# Patient Record
Sex: Male | Born: 1960 | Race: Black or African American | Hispanic: No | Marital: Married | Smoking: Former smoker
Health system: Southern US, Community
[De-identification: ages and names within clinical notes are randomized; demographics above are authoritative.]

## PROBLEM LIST (undated history)

## (undated) DIAGNOSIS — I10 Essential (primary) hypertension: Secondary | ICD-10-CM

## (undated) DIAGNOSIS — E785 Hyperlipidemia, unspecified: Secondary | ICD-10-CM

## (undated) DIAGNOSIS — F1910 Other psychoactive substance abuse, uncomplicated: Secondary | ICD-10-CM

## (undated) DIAGNOSIS — E119 Type 2 diabetes mellitus without complications: Secondary | ICD-10-CM

## (undated) HISTORY — DX: Other psychoactive substance abuse, uncomplicated: F19.10

## (undated) HISTORY — DX: Hyperlipidemia, unspecified: E78.5

## (undated) HISTORY — PX: OTHER SURGICAL HISTORY: SHX169

## (undated) HISTORY — PX: WRIST SURGERY: SHX841

## (undated) HISTORY — DX: Essential (primary) hypertension: I10

## (undated) HISTORY — DX: Type 2 diabetes mellitus without complications: E11.9

---

## 1999-10-18 ENCOUNTER — Encounter: Payer: Self-pay | Admitting: *Deleted

## 1999-10-18 ENCOUNTER — Emergency Department (HOSPITAL_COMMUNITY): Admission: EM | Admit: 1999-10-18 | Discharge: 1999-10-18 | Payer: Self-pay | Admitting: *Deleted

## 2003-02-02 ENCOUNTER — Emergency Department (HOSPITAL_COMMUNITY): Admission: EM | Admit: 2003-02-02 | Discharge: 2003-02-02 | Payer: Self-pay | Admitting: Emergency Medicine

## 2006-01-25 ENCOUNTER — Emergency Department (HOSPITAL_COMMUNITY): Admission: EM | Admit: 2006-01-25 | Discharge: 2006-01-25 | Payer: Self-pay | Admitting: Family Medicine

## 2008-05-02 ENCOUNTER — Emergency Department (HOSPITAL_COMMUNITY): Admission: EM | Admit: 2008-05-02 | Discharge: 2008-05-02 | Payer: Self-pay | Admitting: Emergency Medicine

## 2008-10-25 ENCOUNTER — Emergency Department (HOSPITAL_COMMUNITY): Admission: EM | Admit: 2008-10-25 | Discharge: 2008-10-25 | Payer: Self-pay | Admitting: Emergency Medicine

## 2008-12-28 ENCOUNTER — Emergency Department (HOSPITAL_COMMUNITY): Admission: EM | Admit: 2008-12-28 | Discharge: 2008-12-28 | Payer: Self-pay | Admitting: Emergency Medicine

## 2009-01-04 ENCOUNTER — Ambulatory Visit: Payer: Self-pay | Admitting: Nurse Practitioner

## 2009-01-04 DIAGNOSIS — F17200 Nicotine dependence, unspecified, uncomplicated: Secondary | ICD-10-CM | POA: Insufficient documentation

## 2009-01-04 DIAGNOSIS — I10 Essential (primary) hypertension: Secondary | ICD-10-CM | POA: Insufficient documentation

## 2009-01-04 DIAGNOSIS — E119 Type 2 diabetes mellitus without complications: Secondary | ICD-10-CM | POA: Insufficient documentation

## 2009-01-04 LAB — CONVERTED CEMR LAB
Bilirubin Urine: NEGATIVE
Blood Glucose, Fingerstick: 119
Blood in Urine, dipstick: NEGATIVE
Glucose, Urine, Semiquant: NEGATIVE
Hgb A1c MFr Bld: 7.1 %
Ketones, urine, test strip: NEGATIVE
Nitrite: NEGATIVE
Specific Gravity, Urine: 1.025
Urobilinogen, UA: 0.2
WBC Urine, dipstick: NEGATIVE
pH: 5.5

## 2009-01-05 ENCOUNTER — Telehealth (INDEPENDENT_AMBULATORY_CARE_PROVIDER_SITE_OTHER): Payer: Self-pay | Admitting: Nurse Practitioner

## 2009-01-06 ENCOUNTER — Encounter (INDEPENDENT_AMBULATORY_CARE_PROVIDER_SITE_OTHER): Payer: Self-pay | Admitting: Nurse Practitioner

## 2009-01-06 LAB — CONVERTED CEMR LAB
ALT: 33 units/L (ref 0–53)
AST: 20 units/L (ref 0–37)
Albumin: 4.6 g/dL (ref 3.5–5.2)
Alkaline Phosphatase: 55 units/L (ref 39–117)
BUN: 11 mg/dL (ref 6–23)
Basophils Absolute: 0.1 10*3/uL (ref 0.0–0.1)
Basophils Relative: 1 % (ref 0–1)
CO2: 25 meq/L (ref 19–32)
Calcium: 10.2 mg/dL (ref 8.4–10.5)
Chloride: 106 meq/L (ref 96–112)
Cholesterol: 185 mg/dL (ref 0–200)
Creatinine, Ser: 0.95 mg/dL (ref 0.40–1.50)
Eosinophils Absolute: 0.1 10*3/uL (ref 0.0–0.7)
Eosinophils Relative: 2 % (ref 0–5)
Glucose, Bld: 106 mg/dL — ABNORMAL HIGH (ref 70–99)
HCT: 46 % (ref 39.0–52.0)
HDL: 41 mg/dL (ref >39)
Hemoglobin: 14.7 g/dL (ref 13.0–17.0)
LDL Cholesterol: 108 mg/dL — ABNORMAL HIGH (ref 0–99)
Lymphocytes Relative: 35 % (ref 12–46)
Lymphs Abs: 2.9 10*3/uL (ref 0.7–4.0)
MCHC: 32 g/dL (ref 30.0–36.0)
MCV: 82.7 fL (ref 78.0–100.0)
Monocytes Absolute: 0.6 10*3/uL (ref 0.1–1.0)
Monocytes Relative: 7 % (ref 3–12)
Neutro Abs: 4.7 10*3/uL (ref 1.7–7.7)
Neutrophils Relative %: 57 % (ref 43–77)
PSA: 0.54 ng/mL (ref 0.10–4.00)
Platelets: 447 10*3/uL — ABNORMAL HIGH (ref 150–400)
Potassium: 5.1 meq/L (ref 3.5–5.3)
RBC: 5.56 M/uL (ref 4.22–5.81)
RDW: 14.8 % (ref 11.5–15.5)
Sodium: 143 meq/L (ref 135–145)
TSH: 1.235 microintl units/mL (ref 0.350–4.50)
Total Bilirubin: 0.3 mg/dL (ref 0.3–1.2)
Total CHOL/HDL Ratio: 4.5
Total Protein: 8.2 g/dL (ref 6.0–8.3)
Triglycerides: 181 mg/dL — ABNORMAL HIGH (ref <150)
VLDL: 36 mg/dL (ref 0–40)
WBC: 8.3 10*3/uL (ref 4.0–10.5)

## 2009-01-11 ENCOUNTER — Ambulatory Visit: Payer: Self-pay | Admitting: *Deleted

## 2009-02-01 ENCOUNTER — Ambulatory Visit: Payer: Self-pay | Admitting: Nurse Practitioner

## 2009-02-01 DIAGNOSIS — E7800 Pure hypercholesterolemia, unspecified: Secondary | ICD-10-CM | POA: Insufficient documentation

## 2009-02-01 LAB — CONVERTED CEMR LAB
Bilirubin Urine: NEGATIVE
Blood Glucose, Fingerstick: 126
Glucose, Urine, Semiquant: NEGATIVE
Ketones, urine, test strip: NEGATIVE
Nitrite: NEGATIVE
Protein, U semiquant: 30
Specific Gravity, Urine: 1.025
Urobilinogen, UA: 1
WBC Urine, dipstick: NEGATIVE
pH: 5.5

## 2009-02-02 DIAGNOSIS — R809 Proteinuria, unspecified: Secondary | ICD-10-CM | POA: Insufficient documentation

## 2009-02-02 LAB — CONVERTED CEMR LAB: Microalb, Ur: 4.75 mg/dL — ABNORMAL HIGH (ref 0.00–1.89)

## 2009-02-12 ENCOUNTER — Emergency Department (HOSPITAL_COMMUNITY): Admission: EM | Admit: 2009-02-12 | Discharge: 2009-02-12 | Payer: Self-pay | Admitting: Family Medicine

## 2009-02-17 ENCOUNTER — Ambulatory Visit: Payer: Self-pay | Admitting: Nurse Practitioner

## 2009-02-22 ENCOUNTER — Encounter (INDEPENDENT_AMBULATORY_CARE_PROVIDER_SITE_OTHER): Payer: Self-pay | Admitting: Nurse Practitioner

## 2009-02-24 ENCOUNTER — Encounter (INDEPENDENT_AMBULATORY_CARE_PROVIDER_SITE_OTHER): Payer: Self-pay | Admitting: Nurse Practitioner

## 2009-03-01 ENCOUNTER — Telehealth (INDEPENDENT_AMBULATORY_CARE_PROVIDER_SITE_OTHER): Payer: Self-pay | Admitting: Nurse Practitioner

## 2009-03-01 ENCOUNTER — Ambulatory Visit: Payer: Self-pay | Admitting: Nurse Practitioner

## 2009-03-28 ENCOUNTER — Encounter (INDEPENDENT_AMBULATORY_CARE_PROVIDER_SITE_OTHER): Payer: Self-pay | Admitting: Nurse Practitioner

## 2009-03-29 ENCOUNTER — Ambulatory Visit: Payer: Self-pay | Admitting: Nurse Practitioner

## 2009-03-29 LAB — CONVERTED CEMR LAB
Blood Glucose, Fingerstick: 119
Cholesterol, target level: 200 mg/dL
HDL goal, serum: 40 mg/dL
Hgb A1c MFr Bld: 6 %
LDL Goal: 100 mg/dL

## 2009-03-31 ENCOUNTER — Encounter (INDEPENDENT_AMBULATORY_CARE_PROVIDER_SITE_OTHER): Payer: Self-pay | Admitting: Nurse Practitioner

## 2009-05-18 ENCOUNTER — Telehealth (INDEPENDENT_AMBULATORY_CARE_PROVIDER_SITE_OTHER): Payer: Self-pay | Admitting: Nurse Practitioner

## 2009-05-31 ENCOUNTER — Ambulatory Visit: Payer: Self-pay | Admitting: Nurse Practitioner

## 2009-05-31 LAB — CONVERTED CEMR LAB
Blood Glucose, Fingerstick: 113
Hgb A1c MFr Bld: 6 %

## 2009-06-08 ENCOUNTER — Ambulatory Visit: Payer: Self-pay | Admitting: Nurse Practitioner

## 2009-06-09 ENCOUNTER — Encounter (INDEPENDENT_AMBULATORY_CARE_PROVIDER_SITE_OTHER): Payer: Self-pay | Admitting: Nurse Practitioner

## 2009-06-09 LAB — CONVERTED CEMR LAB
ALT: 21 units/L (ref 0–53)
AST: 18 units/L (ref 0–37)
Albumin: 4.3 g/dL (ref 3.5–5.2)
Alkaline Phosphatase: 40 units/L (ref 39–117)
Bilirubin, Direct: 0.1 mg/dL (ref 0.0–0.3)
Cholesterol: 148 mg/dL (ref 0–200)
HDL: 40 mg/dL (ref >39)
Indirect Bilirubin: 0.2 mg/dL (ref 0.0–0.9)
LDL Cholesterol: 82 mg/dL (ref 0–99)
Total Bilirubin: 0.3 mg/dL (ref 0.3–1.2)
Total CHOL/HDL Ratio: 3.7
Total Protein: 7.3 g/dL (ref 6.0–8.3)
Triglycerides: 132 mg/dL (ref <150)
VLDL: 26 mg/dL (ref 0–40)

## 2009-08-31 ENCOUNTER — Ambulatory Visit: Payer: Self-pay | Admitting: Nurse Practitioner

## 2009-08-31 DIAGNOSIS — K5900 Constipation, unspecified: Secondary | ICD-10-CM | POA: Insufficient documentation

## 2009-08-31 LAB — CONVERTED CEMR LAB
Blood Glucose, Fingerstick: 138
Hgb A1c MFr Bld: 5.9 %

## 2009-10-21 ENCOUNTER — Emergency Department (HOSPITAL_COMMUNITY): Admission: EM | Admit: 2009-10-21 | Discharge: 2009-10-21 | Payer: Self-pay | Admitting: Family Medicine

## 2010-01-10 ENCOUNTER — Ambulatory Visit: Payer: Self-pay | Admitting: Nurse Practitioner

## 2010-01-10 DIAGNOSIS — B351 Tinea unguium: Secondary | ICD-10-CM | POA: Insufficient documentation

## 2010-01-10 LAB — CONVERTED CEMR LAB
Alkaline Phosphatase: 38 units/L — ABNORMAL LOW (ref 39–117)
Basophils Absolute: 0.1 10*3/uL (ref 0.0–0.1)
Basophils Relative: 1 % (ref 0–1)
Blood Glucose, Fingerstick: 102
Glucose, Bld: 103 mg/dL — ABNORMAL HIGH (ref 70–99)
LDL Cholesterol: 73 mg/dL (ref 0–99)
MCHC: 32.6 g/dL (ref 30.0–36.0)
Monocytes Absolute: 0.6 10*3/uL (ref 0.1–1.0)
Neutro Abs: 4.2 10*3/uL (ref 1.7–7.7)
Neutrophils Relative %: 54 % (ref 43–77)
PSA: 0.5 ng/mL (ref 0.10–4.00)
Platelets: 403 10*3/uL — ABNORMAL HIGH (ref 150–400)
RDW: 14.6 % (ref 11.5–15.5)
Rapid HIV Screen: NEGATIVE
Sodium: 139 meq/L (ref 135–145)
Total Bilirubin: 0.4 mg/dL (ref 0.3–1.2)
Total Protein: 7.5 g/dL (ref 6.0–8.3)
Triglycerides: 135 mg/dL (ref <150)
VLDL: 27 mg/dL (ref 0–40)

## 2010-01-23 ENCOUNTER — Encounter (INDEPENDENT_AMBULATORY_CARE_PROVIDER_SITE_OTHER): Payer: Self-pay | Admitting: Nurse Practitioner

## 2010-05-10 ENCOUNTER — Ambulatory Visit: Payer: Self-pay | Admitting: Nurse Practitioner

## 2010-05-10 DIAGNOSIS — E669 Obesity, unspecified: Secondary | ICD-10-CM | POA: Insufficient documentation

## 2010-05-10 DIAGNOSIS — N529 Male erectile dysfunction, unspecified: Secondary | ICD-10-CM | POA: Insufficient documentation

## 2010-05-10 LAB — CONVERTED CEMR LAB: Hgb A1c MFr Bld: 6.5 % — ABNORMAL HIGH (ref <5.7)

## 2010-05-11 ENCOUNTER — Encounter (INDEPENDENT_AMBULATORY_CARE_PROVIDER_SITE_OTHER): Payer: Self-pay | Admitting: Nurse Practitioner

## 2010-09-13 ENCOUNTER — Ambulatory Visit: Payer: Self-pay | Admitting: Nurse Practitioner

## 2010-09-13 LAB — CONVERTED CEMR LAB: Blood Glucose, Fingerstick: 131

## 2010-10-22 ENCOUNTER — Emergency Department (HOSPITAL_COMMUNITY)
Admission: EM | Admit: 2010-10-22 | Discharge: 2010-10-22 | Payer: Self-pay | Source: Home / Self Care | Admitting: Family Medicine

## 2010-12-26 NOTE — Letter (Signed)
Summary: *HSN Results Follow up  [facility]  [address] [place], [place] [address]   Phone: [phone]  Fax: [phone]      05/11/2010   TIWAN SCHNITKER [place] [address] [place], [place]  [address]   Dear  Mr. Tiwan Islam,                            ____S.Drinkard,FNP   ____D. Gore,FNP       ____B. McPherson,MD   ____V. Rankins,MD    ____E. Mulberry,MD    _X___N. Daphine Deutscher, FNP  ____D. Reche Dixon, MD    ____K. Philipp Deputy, MD    ____Other     This letter is to inform you that your recent test(s):  _______Pap Smear    ____X___Lab Test     _______X-ray    ___X____ is within acceptable limits  _______ requires a medication change  _______ requires a follow-up lab visit  _______ requires a follow-up visit with your provider   Comments: Hgba1c = 6.5 during recent lab visit. Your diabetes is still doing well.  Continue current medications.  Remember to keep up your efforts at losing weight.    _________________________________________________________ If you have any questions, please contact our office [phone].                    Sincerely,    Lehman Prom FNP [facility]

## 2010-12-26 NOTE — Assessment & Plan Note (Signed)
Summary: Diabetes/HTN   Vital Signs:  Patient profile:   50 year old male Weight:      204.9 pounds BMI:     29.51 Temp:     97.8 degrees F oral Pulse rate:   71 / minute Pulse rhythm:   regular Resp:     16 per minute BP sitting:   132 / 82  (left arm) Cuff size:   regular  Vitals Entered By: Levon Hedger (May 10, 2010 8:22 AM) CC: follow-up visit DM, Hypertension Management, Lipid Management Is Patient Diabetic? Yes Pain Assessment Patient in pain? no      CBG Result 116 CBG Device ID A  Does patient need assistance? Functional Status Self care Ambulation Normal   CC:  follow-up visit DM, Hypertension Management, and Lipid Management.  History of Present Illness:  Pt into the office for follow up - diabetes  Diabetes Management History:      The patient is a 50 years old male who comes in for evaluation of Type 2 Diabetes Mellitus.  He is (or has been) enrolled in the "Diabetic Education Program".  He states understanding of dietary principles and is following his diet appropriately.  No sensory loss is reported.  Self foot exams are not being performed.  He is not checking home blood sugars.  He says that he is not exercising regularly.        Hypoglycemic symptoms are not occurring.  No hyperglycemic symptoms are reported.  Other comments include: pt is only checking his blood sugar about once per week.        No changes have been made to his treatment plan since last visit.    Hypertension History:      He denies headache, chest pain, and palpitations.  He notes no problems with any antihypertensive medication side effects.  No side effects from medications.        Positive major cardiovascular risk factors include male age 22 years old or older, diabetes, hyperlipidemia, hypertension, and current tobacco user.        Further assessment for target organ damage reveals no history of ASHD, cardiac end-organ damage (CHF/LVH), stroke/TIA, peripheral vascular disease,  renal insufficiency, or hypertensive retinopathy.    Lipid Management History:      Positive NCEP/ATP III risk factors include male age 61 years old or older, diabetes, HDL cholesterol less than 40, current tobacco user, and hypertension.  Negative NCEP/ATP III risk factors include no ASHD (atherosclerotic heart disease), no prior stroke/TIA, no peripheral vascular disease, and no history of aortic aneurysm.        The patient states that he knows about the "Therapeutic Lifestyle Change" diet.  His compliance with the TLC diet is fair.  The patient does not know about adjunctive measures for cholesterol lowering.  Adjunctive measures started by the patient include ASA.  He expresses no side effects from his lipid-lowering medication.  The patient denies any symptoms to suggest myopathy or liver disease.      Habits & Providers  Alcohol-Tobacco-Diet     Alcohol type: quit in 2006     Tobacco Status: current     Tobacco Counseling: to quit use of tobacco products     Cigarette Packs/Day: 1/2     Year Started: age 31     Diet Comments: trying to change diet to accomodate diabetic status  Exercise-Depression-Behavior     Does Patient Exercise: no     Exercise Counseling: to improve exercise regimen  Depression Counseling: not indicated; screening negative for depression     Drug Use: never     Seat Belt Use: always  Comments: Pt was started on Wellbutrin during the last visit and he has been taking as ordered but has not quit smoking.  Admits smoking is his stress reliever.  Allergies (verified): No Known Drug Allergies  Review of Systems CV:  Denies chest pain or discomfort. Resp:  Denies cough. GI:  Denies abdominal pain, nausea, and vomiting. GU:  Complains of erectile dysfunction; Started about 1 month ago.  Physical Exam  General:  alert.   Head:  normocephalic.   Lungs:  normal breath sounds.   Heart:  normal rate and regular rhythm.   Abdomen:  normal bowel sounds.     Msk:  normal ROM.   Neurologic:  alert & oriented X3.    Diabetes Management Exam:    Foot Exam (with socks and/or shoes not present):       Sensory-Monofilament:          Left foot: normal          Right foot: normal   Impression & Recommendations:  Problem # 1:  DIABETES MELLITUS (ICD-250.00) Will order HgbA1c (no in house available) Continue current medications His updated medication list for this problem includes:    Metformin Hcl 500 Mg Xr24h-tab (Metformin hcl) ..... One tablet by mouth daily for blood sugar    Enalapril Maleate 5 Mg Tabs (Enalapril maleate) ..... One tablet by mouth daily for blood pressure    Aspirin Low Strength 81 Mg Chew (Aspirin) ..... One tablet by mouth daily  Orders: Capillary Blood Glucose/CBG (82948) T- Hemoglobin A1C ([address]-37106)  Problem # 2:  HYPERTENSION, BENIGN ESSENTIAL (ICD-401.1) BP is stable continue current medications His updated medication list for this problem includes:    Enalapril Maleate 5 Mg Tabs (Enalapril maleate) ..... One tablet by mouth daily for blood pressure  Problem # 3:  TOBACCO ABUSE (ICD-305.1) pt to encourage cessation will restart wellbutrin  Problem # 4:  HYPERCHOLESTEROLEMIA (ICD-272.0) continue current medications His updated medication list for this problem includes:    Pravastatin Sodium 20 Mg Tabs (Pravastatin sodium) ..... One tablet by mouth nightly for cholesterol  Problem # 5:  ERECTILE DYSFUNCTION, ORGANIC (ICD-607.84) handout given to pt advise pt to start ginseng  Problem # 6:  OBESITY (ICD-278.00) advised pt of recent weight gain pt has admitted to becoming less active - need to increase activity  Complete Medication List: 1)  Metformin Hcl 500 Mg Xr24h-tab (Metformin hcl) .... One tablet by mouth daily for blood sugar 2)  Enalapril Maleate 5 Mg Tabs (Enalapril maleate) .... One tablet by mouth daily for blood pressure 3)  Glucometer Elite Classic Kit (Blood glucose monitoring suppl)  .... Dispense glucometer to check blood sugar at least one time 4)  Lancets Misc (Lancets) .... Check blood sugar once daily dx = 250.00 5)  Sidekick Blood Glucose System Devi (Blood gluc meter disp-strips) .... Dispense test strips to use with meter check blood sugar once per day 6)  Pravastatin Sodium 20 Mg Tabs (Pravastatin sodium) .... One tablet by mouth nightly for cholesterol 7)  Miralax Powd (Polyethylene glycol 3350) .Marland Kitchen.. 1 capful mixed with 8oz glass of water daily 8)  Aspirin Low Strength 81 Mg Chew (Aspirin) .... One tablet by mouth daily 9)  Wellbutrin Sr 150 Mg Xr12h-tab (Bupropion hcl) .... One tablet by mouth daily for 1 week then increase to one tablet by mouth two  times a day 10)  Ginseng 100 Mg Caps (Ginseng) .... One tablet by mouth daily  Diabetes Management Assessment/Plan:      The following lipid goals have been established for the patient: Total cholesterol goal of 200; LDL cholesterol goal of 100; HDL cholesterol goal of 40; Triglyceride goal of 150.  His blood pressure goal is < 130/80.    Hypertension Assessment/Plan:      The patient's hypertensive risk group is category C: Target organ damage and/or diabetes.  His calculated 10 year risk of coronary heart disease is 18 %.  Today's blood pressure is 132/82.  His blood pressure goal is < 130/80.  Lipid Assessment/Plan:      Based on NCEP/ATP III, the patient's risk factor category is "history of diabetes".  The patient's lipid goals are as follows: Total cholesterol goal is 200; LDL cholesterol goal is 100; HDL cholesterol goal is 40; Triglyceride goal is 150.    Diabetic Foot Exam Foot Inspection Is there a history of a foot ulcer?              Yes Is there a foot ulcer now?              No Can the patient see the bottom of their feet?          No Are the shoes appropriate in style and fit?          No Is there swelling or an abnormal foot shape?          No Are the toenails long?                Yes Are the  toenails thick?                Yes Are the toenails ingrown?              No Is there heavy callous build-up?              No Is there pain in the calf muscle (Intermittent claudication) when walking?    NoIs there a claw toe deformity?              No Is there elevated skin temperature?            No Is there limited ankle dorsiflexion?            No Is there foot or ankle muscle weakness?            No  Diabetic Foot Care Education Pulse Check          Right Foot          Left Foot Dorsalis Pedis:        normal            normal    10-g (5.07) Semmes-Weinstein Monofilament Test Performed by: Levon Hedger          Right Foot          Left Foot Visual Inspection                Diabetic Foot Exam Foot Inspection Is there a history of a foot ulcer?              Yes Is there a foot ulcer now?              No Can the patient see the bottom of their feet?          No Are the  shoes appropriate in style and fit?          No Is there swelling or an abnormal foot shape?          No Are the toenails long?                Yes Are the toenails thick?                Yes Are the toenails ingrown?              No Is there heavy callous build-up?              No Is there pain in the calf muscle (Intermittent claudication) when walking?    NoIs there a claw toe deformity?              No Is there elevated skin temperature?            No Is there limited ankle dorsiflexion?            No Is there foot or ankle muscle weakness?            No  Diabetic Foot Care Education Pulse Check          Right Foot          Left Foot Dorsalis Pedis:        normal            normal    10-g (5.07) Semmes-Weinstein Monofilament Test Performed by: Levon Hedger          Right Foot          Left Foot Visual Inspection                Patient Instructions: 1)  Erectile Dysfunction - Read the handout 2)  Try over the counter Ginseng for symptoms 3)  Smoking - keep up your efforts to quit smoking 4)   Restart Wellbutrin 5)  Diabetes - You will be notified of the Hgba1c.  Remember this goal is less than 7. 6)  Follow up in 4 months or sooner if necessary. 7)  Will need flu vaccine at next visit Prescriptions: WELLBUTRIN SR 150 MG XR12H-TAB (BUPROPION HCL) One tablet by mouth daily for 1 week then increase to one tablet by mouth two times a day  #60 x 1   Entered and Authorized by:   Lehman Prom FNP   Signed by:   Lehman Prom FNP on 05/10/2010   Method used:   Faxed to ...       [facility] - [facility] (retail)       [address] [place], [place]  [address]       Ph: [phone]       Fax: [phone]   RxID:   [id number]   Last LDL:                                                 73 (01/10/2010 10:32:00 PM)        Diabetic Foot Exam Pulse Check          Right Foot          Left Foot Dorsalis Pedis:        normal  normal    10-g (5.07) Semmes-Weinstein Monofilament Test Performed by: Levon Hedger          Right Foot          Left Foot Visual Inspection               Test Control      normal         normal Site 1         normal         normal Site 2         normal         normal Site 3         normal         normal Site 4         normal         normal Site 5         normal         normal Site 6         normal         normal Site 7         normal         normal Site 8         normal         normal Site 9         normal         normal Site 10         normal         normal  Impression      normal         normal

## 2010-12-26 NOTE — Letter (Signed)
Summary: REFERRAL PODIATRY//APPT DATE & TIME  REFERRAL PODIATRY//APPT DATE & TIME   Imported By: Arta Bruce 03/06/2010 16:44:05  _____________________________________________________________________  External Attachment:    Type:   Image     Comment:   External Document

## 2010-12-26 NOTE — Assessment & Plan Note (Signed)
Summary: Diabetes/HTN   Vital Signs:  Patient profile:   50 year old male Weight:      196 pounds Temp:     97.7 degrees F Pulse rate:   71 / minute Pulse rhythm:   regular Resp:     16 per minute BP sitting:   101 / 67  (left arm) Cuff size:   large  Vitals Entered By: Vesta Mixer CMA (January 10, 2010 8:40 AM) CC: dm f/u and prostate exam, Hypertension Management, Lipid Management Is Patient Diabetic? Yes Pain Assessment Patient in pain? no      CBG Result 102    CC:  dm f/u and prostate exam, Hypertension Management, and Lipid Management.  History of Present Illness:  Pt into the office for follow up - diabetes.     Tdap - Last done over 10 years ago.  Diabetes Management History:      The patient is a 50 years old male who comes in for evaluation of Type 2 Diabetes Mellitus.  He is (or has been) enrolled in the "Diabetic Education Program".  He states lack of understanding of dietary principles and is not following his diet appropriately.  No sensory loss is reported.  Self foot exams are not being performed.  He is not checking home blood sugars.  He says that he is not exercising regularly.        Hypoglycemic symptoms are not occurring.  No hyperglycemic symptoms are reported.  Other comments include: Only checks blood sugar three times per month.        There are no symptoms to suggest diabetic complications.  No changes have been made to his treatment plan since last visit.    Hypertension History:      He denies headache, chest pain, and palpitations.  He notes no problems with any antihypertensive medication side effects.  Pt is taking meds as ordered.        Positive major cardiovascular risk factors include male age 79 years old or older, diabetes, hyperlipidemia, hypertension, and current tobacco user.        Further assessment for target organ damage reveals no history of ASHD, cardiac end-organ damage (CHF/LVH), stroke/TIA, peripheral vascular disease,  renal insufficiency, or hypertensive retinopathy.    Lipid Management History:      Positive NCEP/ATP III risk factors include male age 95 years old or older, diabetes, current tobacco user, and hypertension.  Negative NCEP/ATP III risk factors include no ASHD (atherosclerotic heart disease), no prior stroke/TIA, no peripheral vascular disease, and no history of aortic aneurysm.        The patient states that he knows about the "Therapeutic Lifestyle Change" diet.  His compliance with the TLC diet is fair.  The patient does not know about adjunctive measures for cholesterol lowering.  He expresses no side effects from his lipid-lowering medication.  The patient denies any symptoms to suggest myopathy or liver disease.      Habits & Providers  Alcohol-Tobacco-Diet     Alcohol type: quit in 2006     Tobacco Status: current     Tobacco Counseling: to quit use of tobacco products     Cigarette Packs/Day: 1/2     Year Started: age 59     Diet Comments: trying to change diet to accomodate diabetic status  Exercise-Depression-Behavior     Does Patient Exercise: no     Exercise Counseling: to improve exercise regimen     Depression Counseling: not indicated; screening negative  for depression     Drug Use: never     Seat Belt Use: always  Comments: Pt would like to quit smoking.  He has tried a friend's chantix and only took 3 day worth of the medication.  Allergies (verified): No Known Drug Allergies  Review of Systems General:  Denies loss of appetite. CV:  Denies chest pain or discomfort. Resp:  Denies cough. GI:  Denies abdominal pain, nausea, and vomiting. Neuro:  Denies headaches.  Physical Exam  General:  alert.   Head:  normocephalic.   Lungs:  normal breath sounds.   Heart:  normal rate.   Abdomen:  normal bowel sounds.   Prostate:  1+ enlarged.   nontender Msk:  up to the exam table Neurologic:  alert & oriented X3.   Psych:  Oriented X3.    Diabetes Management  Exam:       Nails:          Left foot: thickened          Right foot: thickened  Diabetic Foot Exam Foot Inspection Is there a history of a foot ulcer?              No Is there a foot ulcer now?              No Can the patient see the bottom of their feet?          No Are the shoes appropriate in style and fit?          No Is there swelling or an abnormal foot shape?          No Are the toenails long?                Yes Are the toenails thick?                Yes Are the toenails ingrown?              No Is there heavy callous build-up?              No Is there pain in the calf muscle (Intermittent claudication) when walking?    NoIs there a claw toe deformity?              No Is there elevated skin temperature?            No Is there limited ankle dorsiflexion?            No Is there foot or ankle muscle weakness?            No  Diabetic Foot Care Education Pulse Check          Right Foot          Left Foot Dorsalis Pedis:        normal            normal    Impression & Recommendations:  Problem # 1:  DIABETES MELLITUS (ICD-250.00) will check hgba1c if still ok will d/c meds will refer to podiatry for toe nail clipping His updated medication list for this problem includes:    Metformin Hcl 500 Mg Xr24h-tab (Metformin hcl) ..... One tablet by mouth daily for blood sugar **pharmacy - note dose change**    Enalapril Maleate 5 Mg Tabs (Enalapril maleate) ..... One tablet by mouth daily for blood pressure    Aspirin Low Strength 81 Mg Chew (Aspirin) ..... One tablet by mouth daily  Orders: Capillary Blood Glucose/CBG (  [address]) T- Hemoglobin A1C (03474-25956) T-TSH (38756-43329) T-Urine Microalbumin w/creat. ratio [phone]) UA Dipstick w/o Micro (manual) (01093)  Problem # 2:  HYPERTENSION, BENIGN ESSENTIAL (ICD-401.1) BP is stable. can decrease enalapril to 5mg  by mouth daily Continue DASH diet His updated medication list for this problem includes:    Enalapril  Maleate 5 Mg Tabs (Enalapril maleate) ..... One tablet by mouth daily for blood pressure  Orders: T-Comprehensive Metabolic Panel (23557-32202) T-CBC w/Diff (54270-62376) Rapid HIV  (28315) T-RPR (Syphilis) (17616-07371) T-TSH (06269-48546) T-Urine Microalbumin w/creat. ratio [phone])  Problem # 3:  TOBACCO ABUSE (ICD-305.1) pt would like to quit smoking. will start wellbutrin in an effort to help  Problem # 4:  HYPERCHOLESTEROLEMIA (ICD-272.0) will check lipids today His updated medication list for this problem includes:    Pravastatin Sodium 20 Mg Tabs (Pravastatin sodium) ..... One tablet by mouth nightly for cholesterol **note change in dose**  Orders: T-Lipid Profile (93716-96789) T-Comprehensive Metabolic Panel (38101-75102) tdap given today  Complete Medication List: 1)  Metformin Hcl 500 Mg Xr24h-tab (Metformin hcl) .... One tablet by mouth daily for blood sugar **pharmacy - note dose change** 2)  Enalapril Maleate 5 Mg Tabs (Enalapril maleate) .... One tablet by mouth daily for blood pressure 3)  Glucometer Elite Classic Kit (Blood glucose monitoring suppl) .... Dispense glucometer to check blood sugar at least one time 4)  Lancets Misc (Lancets) .... Check blood sugar once daily dx = 250.00 5)  Sidekick Blood Glucose System Devi (Blood gluc meter disp-strips) .... Dispense test strips to use with meter check blood sugar once per day 6)  Pravastatin Sodium 20 Mg Tabs (Pravastatin sodium) .... One tablet by mouth nightly for cholesterol **note change in dose** 7)  Miralax Powd (Polyethylene glycol 3350) .Marland Kitchen.. 1 capful mixed with 8oz glass of water daily 8)  Aspirin Low Strength 81 Mg Chew (Aspirin) .... One tablet by mouth daily 9)  Wellbutrin Sr 150 Mg Xr12h-tab (Bupropion hcl) .... One tablet by mouth daily for 1 week then increase to one tablet by mouth two times a day  Other Orders: Tdap => 9yrs IM [phone]) Admin 1st Vaccine (78242) Admin 1st Vaccine [facility])  [phone]) Hemoccult Guaiac-1 spec.(in office) (82270) T-PSA (43154-00867) Podiatry Referral (Podiatry)  Diabetes Management Assessment/Plan:      The following lipid goals have been established for the patient: Total cholesterol goal of 200; LDL cholesterol goal of 100; HDL cholesterol goal of 40; Triglyceride goal of 150.  His blood pressure goal is < 130/80.    Hypertension Assessment/Plan:      The patient's hypertensive risk group is category C: Target organ damage and/or diabetes.  His calculated 10 year risk of coronary heart disease is 11 %.  Today's blood pressure is 101/67.  His blood pressure goal is < 130/80.  Lipid Assessment/Plan:      Based on NCEP/ATP III, the patient's risk factor category is "history of diabetes".  The patient's lipid goals are as follows: Total cholesterol goal is 200; LDL cholesterol goal is 100; HDL cholesterol goal is 40; Triglyceride goal is 150.    Patient Instructions: 1)  High blood pressure - your blood pressure is doing great.  Will change the enalapril to 5mg  by mouth daily. This is a lower dose. 2)  You will be notified of your diabetes labs. If still doing well will consider decreasing your medications 3)  Smoking - Medications to help you quit smoking has been sent to the pharmacy.  Take as per instructions. 4)  Schedule a referral with podiatry at [facility] 5)  Follow up in 4 months for diabetes and to check status os smoking. Prescriptions: WELLBUTRIN SR 150 MG XR12H-TAB (BUPROPION HCL) One tablet by mouth daily for 1 week then increase to one tablet by mouth two times a day  #60 x 1   Entered and Authorized by:   Lehman Prom FNP   Signed by:   Lehman Prom FNP on 01/10/2010   Method used:   Faxed to ...       [facility] - [facility] (retail)       [address] [place], [place]  [address]       Ph: [phone]       Fax: [phone]   RxID:   [id number] ENALAPRIL MALEATE 5 MG TABS  (ENALAPRIL MALEATE) One tablet by mouth daily for blood pressure  #30 x 5   Entered and Authorized by:   Lehman Prom FNP   Signed by:   Lehman Prom FNP on 01/10/2010   Method used:   Electronically to        [facility] [phone]* (retail)       [address]       [place], [place]  [address]       Ph: [phone]       Fax: [phone]   RxID:   [id number]    Tetanus/Td Vaccine    Vaccine Type: Tdap    Site: left deltoid    Mfr: Sanofi Pasteur    Dose: 0.5 ml    Route: IM    Given by: Vesta Mixer CMA    Exp. Date: 03/02/2012    Lot #: [id number]    VIS given: 10/14/07 version given January 10, 2010.   Laboratory Results   Blood Tests     CBG Random:: 102mg /dL  Date/Time Received: January 10, 2010 2:27 PM   Other Tests  Rapid HIV: negative  Stool - Occult Blood Hemmoccult #1: negative Date: 01/10/2010

## 2010-12-26 NOTE — Letter (Signed)
Summary: *HSN Results Follow up  [facility]  [address] [place], [place] [address]   Phone: [phone]  Fax: [phone]      01/23/2010   Ronald Pierce [address] [place], [place]  [address]   Dear  Mr. Ronald Pierce,                            ____S.Drinkard,FNP   ____D. Gore,FNP       ____B. McPherson,MD   ____V. Rankins,MD    ____E. Mulberry,MD    _X___N. Daphine Deutscher, FNP  ____D. Reche Dixon, MD    ____K. Philipp Deputy, MD    ____Other     This letter is to inform you that your recent test(s):  _______Pap Smear    __X____Lab Test     _______X-ray    __X_____ is within acceptable limits  _______ requires a medication change  _______ requires a follow-up lab visit  _______ requires a follow-up visit with your provider   Comments:  Your Hbga1c = 6.7.  Remember the goal is to keep it less than 7. (It was 5.9 when last checked in 2010).  Be sure to continue your medications, make good food choices and exercise.       _________________________________________________________ If you have any questions, please contact our office [phone].                    Sincerely,    Lehman Prom FNP [facility]

## 2010-12-26 NOTE — Assessment & Plan Note (Signed)
Summary: Diabetes/HTN   Vital Signs:  Patient profile:   50 year old male Weight:      203.8 pounds BMI:     29.35 Temp:     97.1 degrees F oral Pulse rate:   72 / minute Pulse rhythm:   regular Resp:     16 per minute BP sitting:   120 / 78  (left arm) Cuff size:   regular  Vitals Entered By: Levon Hedger (September 13, 2010 8:25 AM)  Nutrition Counseling: Patient's BMI is greater than 25 and therefore counseled on weight management options. CC: follow-up visit DM, Hypertension Management, Lipid Management Is Patient Diabetic? Yes Pain Assessment Patient in pain? no      CBG Result 131 CBG Device ID A  Does patient need assistance? Functional Status Self care Ambulation Normal   CC:  follow-up visit DM, Hypertension Management, and Lipid Management.  History of Present Illness:  Pt into the office for 4 month f/u on diabetes.  Pt presents today with all his medications.    Social - pt is employed  Diabetes Management History:      The patient is a 50 years old male who comes in for evaluation of Type 2 Diabetes Mellitus.  He is (or has been) enrolled in the "Diabetic Education Program".  He states lack of understanding of dietary principles and is not following his diet appropriately.  No sensory loss is reported.  Self foot exams are not being performed.  He is not checking home blood sugars.  He says that he is not exercising regularly.        Hypoglycemic symptoms are not occurring.  No hyperglycemic symptoms are reported.        There are no symptoms to suggest diabetic complications.  No changes have been made to his treatment plan since last visit.    Hypertension History:      He denies headache, chest pain, and palpitations.  He notes no problems with any antihypertensive medication side effects.        Positive major cardiovascular risk factors include male age 37 years old or older, diabetes, hyperlipidemia, hypertension, and current tobacco user.     Further assessment for target organ damage reveals no history of ASHD, cardiac end-organ damage (CHF/LVH), stroke/TIA, peripheral vascular disease, renal insufficiency, or hypertensive retinopathy.    Lipid Management History:      Positive NCEP/ATP III risk factors include male age 57 years old or older, diabetes, HDL cholesterol less than 40, current tobacco user, and hypertension.  Negative NCEP/ATP III risk factors include no ASHD (atherosclerotic heart disease), no prior stroke/TIA, no peripheral vascular disease, and no history of aortic aneurysm.        The patient states that he knows about the "Therapeutic Lifestyle Change" diet.  His compliance with the TLC diet is fair.  The patient does not know about adjunctive measures for cholesterol lowering.  He expresses no side effects from his lipid-lowering medication.  The patient denies any symptoms to suggest myopathy or liver disease.      Habits & Providers  Alcohol-Tobacco-Diet     Alcohol drinks/day: 0     Tobacco Status: current     Tobacco Counseling: to quit use of tobacco products     Cigarette Packs/Day: 1.0  Exercise-Depression-Behavior     Does Patient Exercise: no     Exercise Counseling: to improve exercise regimen     Have you felt down or hopeless? no  Have you felt little pleasure in things? no     Depression Counseling: not indicated; screening negative for depression     Drug Use: never     Seat Belt Use: always  Comments: Pt has tried wellbutrin and has failed.  He does still want to quit smoking.  Medications Prior to Update: 1)  Metformin Hcl 500 Mg Xr24h-Tab (Metformin Hcl) .... One Tablet By Mouth Daily For Blood Sugar 2)  Enalapril Maleate 5 Mg Tabs (Enalapril Maleate) .... One Tablet By Mouth Daily For Blood Pressure 3)  Glucometer Elite Classic  Kit (Blood Glucose Monitoring Suppl) .... Dispense Glucometer To Check Blood Sugar At Least One Time 4)  Lancets  Misc (Lancets) .... Check Blood Sugar  Once Daily Dx = 250.00 5)  Sidekick Blood Glucose System  Devi (Blood Gluc Meter Disp-Strips) .... Dispense Test Strips To Use With Meter Check Blood Sugar Once Per Day 6)  Pravastatin Sodium 20 Mg Tabs (Pravastatin Sodium) .... One Tablet By Mouth Nightly For Cholesterol 7)  Miralax  Powd (Polyethylene Glycol 3350) .Marland Kitchen.. 1 Capful Mixed With 8oz Glass of Water Daily 8)  Aspirin Low Strength 81 Mg Chew (Aspirin) .... One Tablet By Mouth Daily 9)  Wellbutrin Sr 150 Mg Xr12h-Tab (Bupropion Hcl) .... One Tablet By Mouth Daily For 1 Week Then Increase To One Tablet By Mouth Two Times A Day 10)  Ginseng 100 Mg Caps (Ginseng) .... One Tablet By Mouth Daily  Current Medications (verified): 1)  Metformin Hcl 500 Mg Xr24h-Tab (Metformin Hcl) .... One Tablet By Mouth Daily For Blood Sugar 2)  Enalapril Maleate 5 Mg Tabs (Enalapril Maleate) .... One Tablet By Mouth Daily For Blood Pressure 3)  Glucometer Elite Classic  Kit (Blood Glucose Monitoring Suppl) .... Dispense Glucometer To Check Blood Sugar At Least One Time 4)  Lancets  Misc (Lancets) .... Check Blood Sugar Once Daily Dx = 250.00 5)  Sidekick Blood Glucose System  Devi (Blood Gluc Meter Disp-Strips) .... Dispense Test Strips To Use With Meter Check Blood Sugar Once Per Day 6)  Pravastatin Sodium 20 Mg Tabs (Pravastatin Sodium) .... One Tablet By Mouth Nightly For Cholesterol 7)  Miralax  Powd (Polyethylene Glycol 3350) .Marland Kitchen.. 1 Capful Mixed With 8oz Glass of Water Daily 8)  Aspirin Low Strength 81 Mg Chew (Aspirin) .... One Tablet By Mouth Daily 9)  Wellbutrin Sr 150 Mg Xr12h-Tab (Bupropion Hcl) .... One Tablet By Mouth Daily For 1 Week Then Increase To One Tablet By Mouth Two Times A Day 10)  Ginseng 100 Mg Caps (Ginseng) .... One Tablet By Mouth Daily  Allergies (verified): No Known Drug Allergies  Social History: Packs/Day:  1.0  Review of Systems General:  Denies fatigue. CV:  Denies chest pain or discomfort. Resp:  Denies cough. GI:   Denies abdominal pain, nausea, and vomiting.  Physical Exam  General:  alert.   Head:  normocephalic.   Lungs:  normal breath sounds.   Heart:  normal rate and regular rhythm.   Abdomen:  normal bowel sounds.   Msk:  up to the exam table Neurologic:  alert & oriented X3.   Skin:  color normal.   Psych:  Oriented X3.    Diabetes Management Exam:    Foot Exam (with socks and/or shoes not present):       Sensory-Monofilament:          Left foot: normal          Right foot: normal  Inspection:          Left foot: normal          Right foot: normal       Nails:          Left foot: thickened          Right foot: thickened   Impression & Recommendations:  Problem # 1:  DIABETES MELLITUS (ICD-250.00) doing well continue current meds His updated medication list for this problem includes:    Metformin Hcl 500 Mg Xr24h-tab (Metformin hcl) ..... One tablet by mouth daily for blood sugar    Enalapril Maleate 5 Mg Tabs (Enalapril maleate) ..... One tablet by mouth daily for blood pressure    Aspirin Low Strength 81 Mg Chew (Aspirin) ..... One tablet by mouth daily  Orders: Capillary Blood Glucose/CBG (82948) Hemoglobin A1C (83036)  Problem # 2:  HYPERTENSION, BENIGN ESSENTIAL (ICD-401.1) BP is stable continue DASH diet His updated medication list for this problem includes:    Enalapril Maleate 5 Mg Tabs (Enalapril maleate) ..... One tablet by mouth daily for blood pressure  Problem # 3:  TOBACCO ABUSE (ICD-305.1) pt would like to quit. failed with wellbutrin will try chantix - advised him to set a target quit date His updated medication list for this problem includes:    Chantix Starting Month Pak 0.5 Mg X 11 & 1 Mg X 42 Tabs (Varenicline tartrate) .Marland Kitchen... Take by mouth daily according to the starter pack  Problem # 4:  OBESITY (ICD-278.00) down 1 pound since last visit goal to get less than 200  Problem # 5:  NEED PROPHYLACTIC VACCINATION&INOCULATION FLU  (ICD-V04.81) given today in office  Complete Medication List: 1)  Metformin Hcl 500 Mg Xr24h-tab (Metformin hcl) .... One tablet by mouth daily for blood sugar 2)  Enalapril Maleate 5 Mg Tabs (Enalapril maleate) .... One tablet by mouth daily for blood pressure 3)  Glucometer Elite Classic Kit (Blood glucose monitoring suppl) .... Dispense glucometer to check blood sugar at least one time 4)  Lancets Misc (Lancets) .... Check blood sugar once daily dx = 250.00 5)  Sidekick Blood Glucose System Devi (Blood gluc meter disp-strips) .... Dispense test strips to use with meter check blood sugar once per day 6)  Pravastatin Sodium 20 Mg Tabs (Pravastatin sodium) .... One tablet by mouth nightly for cholesterol 7)  Miralax Powd (Polyethylene glycol 3350) .Marland Kitchen.. 1 capful mixed with 8oz glass of water daily 8)  Aspirin Low Strength 81 Mg Chew (Aspirin) .... One tablet by mouth daily 9)  Ginseng 100 Mg Caps (Ginseng) .... One tablet by mouth daily 10)  Chantix Starting Month Pak 0.5 Mg X 11 & 1 Mg X 42 Tabs (Varenicline tartrate) .... Take by mouth daily according to the starter pack  Other Orders: Flu Vaccine 65yrs + (04540) Admin 1st Vaccine (98119)  Diabetes Management Assessment/Plan:      The following lipid goals have been established for the patient: Total cholesterol goal of 200; LDL cholesterol goal of 100; HDL cholesterol goal of 40; Triglyceride goal of 150.  His blood pressure goal is < 130/80.    Hypertension Assessment/Plan:      The patient's hypertensive risk group is category C: Target organ damage and/or diabetes.  His calculated 10 year risk of coronary heart disease is 14 %.  Today's blood pressure is 120/78.  His blood pressure goal is < 130/80.  Lipid Assessment/Plan:      Based on NCEP/ATP III, the patient's risk factor  category is "history of diabetes".  The patient's lipid goals are as follows: Total cholesterol goal is 200; LDL cholesterol goal is 100; HDL cholesterol goal is  40; Triglyceride goal is 150.  His LDL cholesterol goal has been met.    Patient Instructions: 1)  Schedule appointment for a complete physical exam in January 2012. 2)  Come fasting after midnight before this visit. 3)  will need ekg, rectal/prostate, cbc, hgba1c, cmp, lipids, u/a, TSH, HIV, PSA, microalbumin, PHQ-9 4)  You have received the flu vaccine today. 5)  Smoking - keep up effort to quit smoking.  A prescription for chantix will be sent to [facility] pharmacy.  Once you start the medication you should set a smoking quit date for 1 week after you start the medications. 6)  Diabetes - doing great.  Keep up the good work.  Your Hgba1c = 6.1 today.   7)  Blood pressure - 120/78.  Good! 8)  Continue all your current medications Prescriptions: CHANTIX STARTING MONTH PAK 0.5 MG X 11 & 1 MG X 42 TABS (VARENICLINE TARTRATE) Take by mouth daily according to the starter pack  #1 month qs x 0   Entered and Authorized by:   Lehman Prom FNP   Signed by:   Lehman Prom FNP on 09/13/2010   Method used:   Faxed to ...       [facility] (retail)       [address] [place], [place]  [address]       Ph: [phone]       Fax: [phone]   RxID:   [id number] PRAVASTATIN SODIUM 20 MG TABS (PRAVASTATIN SODIUM) One tablet by mouth nightly for cholesterol  #30 x 5   Entered and Authorized by:   Lehman Prom FNP   Signed by:   Lehman Prom FNP on 09/13/2010   Method used:   Print then Give to Patient   RxID:   [id number] ENALAPRIL MALEATE 5 MG TABS (ENALAPRIL MALEATE) One tablet by mouth daily for blood pressure  #30 Each x 5   Entered and Authorized by:   Lehman Prom FNP   Signed by:   Lehman Prom FNP on 09/13/2010   Method used:   Print then Give to Patient   RxID:   [id number] METFORMIN HCL 500 MG XR24H-TAB (METFORMIN HCL) One tablet by mouth daily for blood sugar  #30 x 5   Entered and Authorized by:    Lehman Prom FNP   Signed by:   Lehman Prom FNP on 09/13/2010   Method used:   Print then Give to Patient   RxID:   [id number]   Diabetic Foot Exam Foot Inspection Is there a history of a foot ulcer?              No Is there a foot ulcer now?              No Can the patient see the bottom of their feet?          No Are the shoes appropriate in style and fit?          Yes Is there swelling or an abnormal foot shape?          No Are the toenails long?                Yes Are the toenails thick?  Yes Are the toenails ingrown?              No Is there heavy callous build-up?              No Is there pain in the calf muscle (Intermittent claudication) when walking?    NoIs there a claw toe deformity?              No Is there elevated skin temperature?            No Is there limited ankle dorsiflexion?            No Is there foot or ankle muscle weakness?            No  Diabetic Foot Care Education Patient educated on appropriate care of diabetic feet.  Pulse Check          Right Foot          Left Foot Dorsalis Pedis:        normal            normal    10-g (5.07) Semmes-Weinstein Monofilament Test Performed by: Levon Hedger          Right Foot          Left Foot Visual Inspection                 Orders Added: 1)  Capillary Blood Glucose/CBG [82948] 2)  Flu Vaccine 80yrs + [90658] 3)  Admin 1st Vaccine [90471] 4)  Est. Patient Level III [[address]] 5)  Hemoglobin A1C [83036]   Immunizations Administered:  Influenza Vaccine # 1:    Vaccine Type: Fluvax 3+    Site: right deltoid    Mfr: GlaxoSmithKline    Dose: 0.5 ml    Route: IM    Given by: Levon Hedger    Exp. Date: 05/26/2011    Lot #: [id number]    VIS given: 06/20/10 version given September 13, 2010.  Flu Vaccine Consent Questions:    Do you have a history of severe allergic reactions to this vaccine? no    Any prior history of allergic reactions to egg and/or gelatin? no    Do you  have a sensitivity to the preservative Thimersol? no    Do you have a past history of Guillan-Barre Syndrome? no    Do you currently have an acute febrile illness? no    Have you ever had a severe reaction to latex? no    Vaccine information given and explained to patient? yes    ndc [id number]  Immunizations Administered:  Influenza Vaccine # 1:    Vaccine Type: Fluvax 3+    Site: right deltoid    Mfr: GlaxoSmithKline    Dose: 0.5 ml    Route: IM    Given by: Levon Hedger    Exp. Date: 05/26/2011    Lot #: [id number]    VIS given: 06/20/10 version given September 13, 2010.     Last LDL:                                                 73 (01/10/2010 10:32:00 PM)          Diabetic Foot Exam Diabetic Foot Care Education :Patient educated on appropriate care of diabetic feet.  Pulse Check  Right Foot          Left Foot Dorsalis Pedis:        normal            normal    10-g (5.07) Semmes-Weinstein Monofilament Test Performed by: Levon Hedger          Right Foot          Left Foot Visual Inspection               Test Control      normal         normal Site 1         normal         normal Site 2         normal         normal Site 3         normal         normal Site 4         normal         normal Site 5         normal         normal Site 6         normal         abnormal Site 7         normal         normal Site 8         normal         abnormal Site 9         abnormal         abnormal Site 10         normal         normal  Impression      normal         normal   Laboratory Results   Blood Tests   Date/Time Received: September 13, 2010 8:44 AM   HGBA1C: 6.1%   (Normal Range: Non-Diabetic - 3-6%   Control Diabetic - 6-8%) CBG Random:: 131      Prevention & Chronic Care Immunizations   Influenza vaccine: Fluvax 3+  (09/13/2010)    Tetanus booster: 01/10/2010: Tdap    Pneumococcal vaccine: Pneumovax  (02/01/2009)  Colorectal Screening    Hemoccult: Not documented    Colonoscopy: Not documented  Other Screening   PSA: 0.50  (01/10/2010)   Smoking status: current  (09/13/2010)   Smoking cessation counseling: yes  (01/04/2009)  Diabetes Mellitus   HgbA1C: 6.1  (09/13/2010)    Eye exam: normal  (02/22/2009)    Foot exam: yes  (09/13/2010)   High risk foot: Not documented   Foot care education: Done  (09/13/2010)    Urine microalbumin/creatinine ratio: Not documented  Lipids   Total Cholesterol: 134  (01/10/2010)   LDL: 73  (01/10/2010)   LDL Direct: Not documented   HDL: 34  (01/10/2010)   Triglycerides: 135  (01/10/2010)    SGOT (AST): 18  (01/10/2010)   SGPT (ALT): 20  (01/10/2010)   Alkaline phosphatase: 38  (01/10/2010)   Total bilirubin: 0.4  (01/10/2010)  Hypertension   Last Blood Pressure: 120 / 78  (09/13/2010)   Serum creatinine: 0.88  (01/10/2010)   Serum potassium 4.6  (01/10/2010)  Self-Management Support :    Diabetes self-management support: Not documented    Hypertension self-management support: Not documented    Lipid self-management support: Not documented    Nursing Instructions: Give Flu vaccine  today

## 2010-12-26 NOTE — Letter (Signed)
Summary: Handout Printed  Printed Handout:  - ED (Erectile Dysfunction) 

## 2010-12-29 ENCOUNTER — Encounter (INDEPENDENT_AMBULATORY_CARE_PROVIDER_SITE_OTHER): Payer: Self-pay | Admitting: Nurse Practitioner

## 2010-12-29 ENCOUNTER — Encounter: Payer: Self-pay | Admitting: Nurse Practitioner

## 2010-12-29 ENCOUNTER — Ambulatory Visit: Admit: 2010-12-29 | Payer: Self-pay | Admitting: Nurse Practitioner

## 2010-12-29 LAB — CONVERTED CEMR LAB
Bilirubin Urine: NEGATIVE
Blood Glucose, Fingerstick: 104
Glucose, Urine, Semiquant: NEGATIVE
Hgb A1c MFr Bld: 6.1 %
OCCULT 1: NEGATIVE
Specific Gravity, Urine: 1.01
pH: 5.5

## 2011-01-03 ENCOUNTER — Telehealth (INDEPENDENT_AMBULATORY_CARE_PROVIDER_SITE_OTHER): Payer: Self-pay | Admitting: Nurse Practitioner

## 2011-01-03 LAB — CONVERTED CEMR LAB
AST: 21 units/L (ref 0–37)
Alkaline Phosphatase: 44 units/L (ref 39–117)
BUN: 11 mg/dL (ref 6–23)
Basophils Relative: 1 % (ref 0–1)
Creatinine, Ser: 0.82 mg/dL (ref 0.40–1.50)
Eosinophils Absolute: 0.1 10*3/uL (ref 0.0–0.7)
HDL: 38 mg/dL — ABNORMAL LOW (ref >39)
Hemoglobin: 15 g/dL (ref 13.0–17.0)
LDL Cholesterol: 70 mg/dL (ref 0–99)
MCHC: 32.1 g/dL (ref 30.0–36.0)
MCV: 84.4 fL (ref 78.0–100.0)
Monocytes Absolute: 0.5 10*3/uL (ref 0.1–1.0)
Monocytes Relative: 8 % (ref 3–12)
Neutrophils Relative %: 54 % (ref 43–77)
PSA: 0.51 ng/mL (ref <=4.00)
RBC: 5.53 M/uL (ref 4.22–5.81)
TSH: 0.773 microintl units/mL (ref 0.350–4.500)
Total CHOL/HDL Ratio: 3.4

## 2011-01-03 NOTE — Assessment & Plan Note (Signed)
Summary: Complete Physical Exam   Vital Signs:  Patient profile:   50 year old male Weight:      195.8 pounds Temp:     97.0 degrees F oral Pulse rate:   76 / minute Pulse rhythm:   regular Resp:     20 per minute BP sitting:   128 / 80  (left arm) Cuff size:   regular  Vitals Entered By: Levon Hedger (December 29, 2010 10:22 AM) CC: CPE...fasting labs, Hypertension Management, Lipid Management Is Patient Diabetic? Yes Pain Assessment Patient in pain? no      CBG Result 104 CBG Device ID A  Does patient need assistance? Functional Status Self care Ambulation Normal  Vision Screening:Left eye w/o correction: 20 / 40 Right Eye w/o correction: 20 / 40 Both eyes w/o correction:  20/ 40        Vision Entered By: Levon Hedger (December 29, 2010 11:14 AM)   CC:  CPE...fasting labs, Hypertension Management, and Lipid Management.  History of Present Illness:  Pt into the office for complete physical exam  Obesity - down 8 pounds since the last visit. "I am working hard to get the weigh offt"  Pt is fasting today for labs  Diabetes Management History:      The patient is a 50 years old male who comes in for evaluation of Type 2 Diabetes Mellitus.  He is (or has been) enrolled in the "Diabetic Education Program".  He states lack of understanding of dietary principles and is not following his diet appropriately.  No sensory loss is reported.  Self foot exams are not being performed.  He is checking home blood sugars.  He says that he is not exercising regularly.        Hypoglycemic symptoms are not occurring.  No hyperglycemic symptoms are reported.  Other comments include: Pt supposed to check blood sugar at least daily before breakfast.  .        No changes have been made to his treatment plan since last visit.    Hypertension History:      He denies headache, chest pain, and palpitations.  He notes no problems with any antihypertensive medication side effects.        Positive major cardiovascular risk factors include male age 36 years old or older, diabetes, hyperlipidemia, hypertension, and current tobacco user.        Further assessment for target organ damage reveals no history of ASHD, cardiac end-organ damage (CHF/LVH), stroke/TIA, peripheral vascular disease, renal insufficiency, or hypertensive retinopathy.    Lipid Management History:      Positive NCEP/ATP III risk factors include male age 36 years old or older, diabetes, HDL cholesterol less than 40, current tobacco user, and hypertension.  Negative NCEP/ATP III risk factors include no ASHD (atherosclerotic heart disease), no prior stroke/TIA, no peripheral vascular disease, and no history of aortic aneurysm.        The patient states that he knows about the "Therapeutic Lifestyle Change" diet.  His compliance with the TLC diet is fair.  He expresses no side effects from his lipid-lowering medication.  The patient denies any symptoms to suggest myopathy or liver disease.      Habits & Providers  Alcohol-Tobacco-Diet     Alcohol drinks/day: 0     Alcohol type: quit in 2006     Tobacco Status: current     Tobacco Counseling: to quit use of tobacco products     Cigarette Packs/Day: 1.0  Year Started: age 56     Diet Comments: trying to change diet to accomodate diabetic status  Exercise-Depression-Behavior     Does Patient Exercise: no     Exercise Counseling: to improve exercise regimen     Have you felt down or hopeless? no     Have you felt little pleasure in things? no     Depression Counseling: not indicated; screening negative for depression     Drug Use: never     Seat Belt Use: always  Comments: PHQ- 9 score = 1  Allergies (verified): No Known Drug Allergies  Review of Systems General:  Denies fever. Eyes:  Denies discharge. ENT:  Denies earache. CV:  Denies chest pain or discomfort. Resp:  Denies cough. GI:  Denies abdominal pain, nausea, and vomiting. GU:  Denies  dysuria. MS:  Denies joint pain. Derm:  Denies dryness.  Physical Exam  General:  alert.   Head:  normocephalic.   Eyes:  pupils round.   Ears:  bil with minimal cerumen Nose:  no nasal discharge.   Mouth:  pharynx pink and moist and poor dentition.   Neck:  supple.   Chest Wall:  no masses.   Breasts:  right breast with lypoma - present for many years, nontender Lungs:  normal breath sounds.   Heart:  normal rate and regular rhythm.   Abdomen:  soft, non-tender, and normal bowel sounds.   Rectal:  external hemorrhoid(s).   Genitalia:  circumcised.  no inguinal hernia no scrotal masses Prostate:  2+ enlarged.  tender Msk:  up to the exam table Extremities:  no edema Neurologic:  alert & oriented X3.   Skin:  color normal.   Psych:  Oriented X3.     Impression & Recommendations:  Problem # 1:  HYPERTENSION, BENIGN ESSENTIAL (ICD-401.1) BP is stable continue current meds. DASH diet His updated medication list for this problem includes:    Enalapril Maleate 5 Mg Tabs (Enalapril maleate) ..... One tablet by mouth daily for blood pressure  Orders: EKG w/ Interpretation (93000)  Problem # 2:  DIABETES MELLITUS (ICD-250.00) Hgba1c = 6.1 Pt is doing well His updated medication list for this problem includes:    Metformin Hcl 500 Mg Xr24h-tab (Metformin hcl) ..... One tablet by mouth daily for blood sugar    Enalapril Maleate 5 Mg Tabs (Enalapril maleate) ..... One tablet by mouth daily for blood pressure    Aspirin Low Strength 81 Mg Chew (Aspirin) ..... One tablet by mouth daily  Orders: Capillary Blood Glucose/CBG (16109) Hemoglobin A1C (60454) Vision Screening (09811) T-Lipid Profile (91478-29562) T-Comprehensive Metabolic Panel [id number]) T-PSA (96295-28413) T-CBC w/Diff (24401-02725) T-HIV Antibody  (Reflex) (36644-03474) T-Syphilis Test (RPR) (25956-38756) T-TSH (43329-51884) UA Dipstick w/o Micro (manual) (81002) T-Urine Microalbumin w/creat. ratio  [phone])  Problem # 3:  TOBACCO ABUSE (ICD-305.1) advised cessation The following medications were removed from the medication list:    Chantix Starting Month Pak 0.5 Mg X 11 & 1 Mg X 42 Tabs (Varenicline tartrate) .Marland Kitchen... Take by mouth daily according to the starter pack  Problem # 4:  HYPERCHOLESTEROLEMIA (ICD-272.0) will check labs today His updated medication list for this problem includes:    Pravastatin Sodium 20 Mg Tabs (Pravastatin sodium) ..... One tablet by mouth nightly for cholesterol  Problem # 5:  OBESITY (ICD-278.00)  down 8 pounds since last visit  Orders: Hemoglobin A1C (23557)  Complete Medication List: 1)  Metformin Hcl 500 Mg Xr24h-tab (Metformin hcl) .... One tablet by mouth daily  for blood sugar 2)  Enalapril Maleate 5 Mg Tabs (Enalapril maleate) .... One tablet by mouth daily for blood pressure 3)  Glucometer Elite Classic Kit (Blood glucose monitoring suppl) .... Dispense glucometer to check blood sugar at least one time 4)  Lancets Misc (Lancets) .... Check blood sugar once daily 5)  Pravastatin Sodium 20 Mg Tabs (Pravastatin sodium) .... One tablet by mouth nightly for cholesterol 6)  Aspirin Low Strength 81 Mg Chew (Aspirin) .... One tablet by mouth daily 7)  Ginseng 100 Mg Caps (Ginseng) .... One tablet by mouth daily 8)  Blood Glucose Test Strp (Glucose blood) .... Use to check blood sugar daily before breakfast  Other Orders: Hemoccult Guaiac-1 spec.(in office) (45409)  Diabetes Management Assessment/Plan:      The following lipid goals have been established for the patient: Total cholesterol goal of 200; LDL cholesterol goal of 100; HDL cholesterol goal of 40; Triglyceride goal of 150.  His blood pressure goal is < 130/80.    Hypertension Assessment/Plan:      The patient's hypertensive risk group is category C: Target organ damage and/or diabetes.  His calculated 10 year risk of coronary heart disease is 14 %.  Today's blood pressure is  128/80.  His blood pressure goal is < 130/80.  Lipid Assessment/Plan:      Based on NCEP/ATP III, the patient's risk factor category is "history of diabetes".  The patient's lipid goals are as follows: Total cholesterol goal is 200; LDL cholesterol goal is 100; HDL cholesterol goal is 40; Triglyceride goal is 150.  His LDL cholesterol goal has been met.    Patient Instructions: 1)  Your blood pressure and blood sugar are doing GREAT.   2)  Keep up the good work.  3)  Your labs will be checked today and you will be notified of the results. 4)  Your HgbA1c = 6.1 (Very good) 5)  Blood pressure - 128/80 6)  A prescription for a glucometer has been sent to [facility] pharmacy. 7)  All other prescriptions can be refilled at [facility] when the are due. 8)  Follow up in 6 months for blood pressure and blood sugar. Prescriptions: GLUCOMETER ELITE CLASSIC  KIT (BLOOD GLUCOSE MONITORING SUPPL) dispense glucometer to check blood sugar at least one time  #1 meter x 0   Entered and Authorized by:   Lehman Prom FNP   Signed by:   Lehman Prom FNP on 12/29/2010   Method used:   Faxed to ...       [facility] (retail)       [address] [place], [place]  [address]       Ph: [phone]       Fax: [phone]   RxID:   [id number] LANCETS  MISC (LANCETS) check blood sugar once daily  #50 x 11   Entered and Authorized by:   Lehman Prom FNP   Signed by:   Lehman Prom FNP on 12/29/2010   Method used:   Faxed to ...       [facility] (retail)       [address] [place], [place]  [address]       Ph: [phone]       Fax: [phone]   RxID:   [id number] BLOOD GLUCOSE TEST  STRP (GLUCOSE BLOOD) Use to check blood sugar daily before breakfast  #50 x  11   Entered and Authorized by:   Lehman Prom FNP   Signed by:   Lehman Prom FNP on 12/29/2010   Method used:   Faxed to ...        [facility] (retail)       [address] [place], [place]  [address]       Ph: [phone]       Fax: [phone]   RxID:   [id number]    Orders Added: 1)  Capillary Blood Glucose/CBG [82948] 2)  Est. Patient Level IV [25956] 3)  Hemoglobin A1C [83036] 4)  Vision Screening [99173] 5)  T-Lipid Profile [80061-22930] 6)  T-Comprehensive Metabolic Panel [80053-22900] 7)  T-PSA [38756-43329] 8)  T-CBC w/Diff [51884-16606] 9)  T-HIV Antibody  (Reflex) [30160-10932] 10)  T-Syphilis Test (RPR) [35573-22025] 11)  T-TSH [42706-23762] 12)  UA Dipstick w/o Micro (manual) [81002] 13)  T-Urine Microalbumin w/creat. ratio [82043-[address]] 14)  EKG w/ Interpretation [93000] 15)  Hemoccult Guaiac-1 spec.(in office) [82270]    Laboratory Results   Urine Tests  Date/Time Received: December 29, 2010 1:26 PM      Blood Tests   Date/Time Received: December 29, 2010 10:57 AM   HGBA1C: 6.1%   (Normal Range: Non-Diabetic - 3-6%   Control Diabetic - 6-8%) CBG Random:: 104    Stool - Occult Blood Hemmoccult #1: negative Date: 12/29/2010    Laboratory Results   Blood Tests     HGBA1C: 6.1%   (Normal Range: Non-Diabetic - 3-6%   Control Diabetic - 6-8%) CBG Random:: 104mg /dL    Stool - Occult Blood Hemmoccult #1: negative     EKG  Procedure date:  12/29/2010  Findings:      sinus rhythm with 1st degree AV block

## 2011-01-03 NOTE — Progress Notes (Signed)
Summary: Office Visit//DEPRESSION SCREENING  Office Visit//DEPRESSION SCREENING   Imported By: Arta Bruce 12/29/2010 15:00:23  _____________________________________________________________________  External Attachment:    Type:   Image     Comment:   External Document

## 2011-01-03 NOTE — Progress Notes (Signed)
Summary: Office Visit//VISIT Reuben Likes  Office Visit//VISIT QUESTIONEER   Imported By: Silvio Pate Stanislawscyk 12/29/2010 14:58:18  _____________________________________________________________________  External Attachment:    Type:   Image     Comment:   External Document

## 2011-01-11 NOTE — Progress Notes (Signed)
Summary: Need appt  Phone Note Outgoing Call   Summary of Call: notify pt that i need him to stop by this week to review some labs results done on recent visit. Schedule in triage nurse slot just to given him and [place] an approximate arrival time but do not charge the pt it will just be a brief consultation Initial call taken by: Lehman Prom FNP,  January 03, 2011 3:07 PM  Follow-up for Phone Call        pt is aware will come in the morning Follow-up by: [place] Shannon,  January 03, 2011 4:03 PM

## 2011-03-13 LAB — GLUCOSE, CAPILLARY: Glucose-Capillary: 121 mg/dL — ABNORMAL HIGH (ref 70–99)

## 2011-08-28 LAB — POCT I-STAT, CHEM 8
Calcium, Ion: 1.19 mmol/L (ref 1.12–1.32)
Creatinine, Ser: 0.9 mg/dL (ref 0.4–1.5)
Hemoglobin: 17.3 g/dL — ABNORMAL HIGH (ref 13.0–17.0)
Sodium: 137 mEq/L (ref 135–145)
TCO2: 26 mmol/L (ref 0–100)

## 2011-08-28 LAB — GLUCOSE, CAPILLARY: Glucose-Capillary: 284 mg/dL — ABNORMAL HIGH (ref 70–99)

## 2011-08-28 LAB — HEMOGLOBIN A1C: Mean Plasma Glucose: 246 mg/dL

## 2012-09-22 ENCOUNTER — Encounter: Payer: Self-pay | Admitting: Family

## 2012-09-22 ENCOUNTER — Ambulatory Visit (INDEPENDENT_AMBULATORY_CARE_PROVIDER_SITE_OTHER): Payer: Self-pay | Admitting: Family

## 2012-09-22 VITALS — BP 164/98 | HR 88 | Temp 98.3°F | Ht 70.0 in | Wt 199.0 lb

## 2012-09-22 DIAGNOSIS — I10 Essential (primary) hypertension: Secondary | ICD-10-CM

## 2012-09-22 DIAGNOSIS — F17200 Nicotine dependence, unspecified, uncomplicated: Secondary | ICD-10-CM

## 2012-09-22 DIAGNOSIS — E785 Hyperlipidemia, unspecified: Secondary | ICD-10-CM

## 2012-09-22 DIAGNOSIS — IMO0001 Reserved for inherently not codable concepts without codable children: Secondary | ICD-10-CM

## 2012-09-22 DIAGNOSIS — Z720 Tobacco use: Secondary | ICD-10-CM

## 2012-09-22 DIAGNOSIS — E1165 Type 2 diabetes mellitus with hyperglycemia: Secondary | ICD-10-CM

## 2012-09-22 LAB — CBC WITH DIFFERENTIAL/PLATELET
Basophils Absolute: 0 10*3/uL (ref 0.0–0.1)
Eosinophils Absolute: 0.1 10*3/uL (ref 0.0–0.7)
HCT: 48.8 % (ref 39.0–52.0)
Hemoglobin: 15.8 g/dL (ref 13.0–17.0)
Lymphs Abs: 1.9 10*3/uL (ref 0.7–4.0)
MCHC: 32.4 g/dL (ref 30.0–36.0)
Neutro Abs: 6.3 10*3/uL (ref 1.4–7.7)
Platelets: 365 10*3/uL (ref 150.0–400.0)
RDW: 14.1 % (ref 11.5–14.6)

## 2012-09-22 LAB — COMPREHENSIVE METABOLIC PANEL
AST: 23 U/L (ref 0–37)
Albumin: 4 g/dL (ref 3.5–5.2)
Alkaline Phosphatase: 42 U/L (ref 39–117)
BUN: 11 mg/dL (ref 6–23)
Potassium: 4.5 mEq/L (ref 3.5–5.1)
Total Bilirubin: 0.6 mg/dL (ref 0.3–1.2)

## 2012-09-22 LAB — LIPID PANEL
Cholesterol: 146 mg/dL (ref 0–200)
HDL: 39 mg/dL — ABNORMAL LOW (ref >39.00)
LDL Cholesterol: 81 mg/dL (ref 0–99)
Triglycerides: 128 mg/dL (ref 0.0–149.0)

## 2012-09-22 MED ORDER — METFORMIN HCL ER (MOD) 500 MG PO TB24: 500.0000 mg | ORAL_TABLET | Freq: Every day | ORAL | Status: DC

## 2012-09-22 MED ORDER — ENALAPRIL MALEATE 5 MG PO TABS: 5.0000 mg | ORAL_TABLET | Freq: Every day | ORAL | Status: DC

## 2012-09-22 MED ORDER — PRAVASTATIN SODIUM 20 MG PO TABS: 20.0000 mg | ORAL_TABLET | Freq: Every day | ORAL | Status: DC

## 2012-09-22 NOTE — Progress Notes (Signed)
Subjective:    Patient ID: Ronald Pierce, male    DOB: [birth year], 51 y.o.   MRN: [id number]  HPI 51 year old African American male, one pack per day smoker, is in to be established. He has a history of type 2 diabetes, hypertension, hyperlipidemia. He has been off of his medication x1 month. Reports having some erectile dysfunction since she's been off of his medication. Had not had issues previously. Has been checking his Pierce sugars and they've been around 180 postprandially.   Review of Systems  Constitutional: Negative.   HENT: Negative.   Respiratory: Negative.   Cardiovascular: Negative.   Genitourinary: Positive for frequency. Negative for urgency, penile swelling and penile pain.  Musculoskeletal: Negative.   Skin: Negative.   Neurological: Negative.   Hematological: Negative.   Psychiatric/Behavioral: Negative.    Past Medical History  Diagnosis Date  . Diabetes mellitus without complication   . Hyperlipidemia   . Hypertension     History   Social History  . Marital Status: Married    Spouse Name: N/A    Number of Children: N/A  . Years of Education: N/A   Occupational History  . Not on file.   Social History Main Topics  . Smoking status: Current Every Day Smoker  . Smokeless tobacco: Not on file  . Alcohol Use: Yes  . Drug Use: No  . Sexually Active: Not on file   Other Topics Concern  . Not on file   Social History Narrative  . No narrative on file    Past Surgical History  Procedure Date  . Wrist surgery     Family History  Problem Relation Age of Onset  . Diabetes Mother   . Hypertension Mother   . Diabetes Maternal Grandmother   . Hypertension Maternal Grandmother   . Diabetes Maternal Grandfather   . Hypertension Maternal Grandfather     No Known Allergies  Current Outpatient Prescriptions on File Prior to Visit  Medication Sig Dispense Refill  . enalapril (VASOTEC) 5 MG tablet Take 1 tablet (5 mg total) by mouth daily.  30  tablet  3  . metFORMIN (GLUMETZA) 500 MG (MOD) 24 hr tablet Take 1 tablet (500 mg total) by mouth daily with breakfast.  30 tablet  3  . pravastatin (PRAVACHOL) 20 MG tablet Take 20 mg by mouth daily.        BP 164/98  Pulse 88  Temp 98.3 F (36.8 C) (Oral)  Ht 5\' 10"  (1.778 m)  Wt 199 lb (90.266 kg)  BMI 28.55 kg/m2  SpO2 96%chart    Objective:   Physical Exam  Constitutional: He is oriented to person, place, and time. He appears well-developed and well-nourished.  HENT:  Right Ear: External ear normal.  Left Ear: External ear normal.  Nose: Nose normal.  Mouth/Throat: Oropharynx is clear and moist.  Neck: Normal range of motion. Neck supple.  Cardiovascular: Normal rate, regular rhythm and normal heart sounds.   Pulmonary/Chest: Effort normal and breath sounds normal.  Musculoskeletal: Normal range of motion.  Neurological: He is alert and oriented to person, place, and time.  Skin: Skin is warm and dry.  Psychiatric: He has a normal mood and affect.          Assessment & Plan:  Assessment: Type 2 diabetes-uncontrolled hypertension-uncontrolled, hyperlipidemia  Plan: Lab sent to include urine microalbumin, A1c, CMP, lipids and notify patient pending results. Encouraged healthy diet and exercise. Bring patient back for recheck in 3 months and sooner when  necessary. Advise diabetic eye exam.

## 2012-09-22 NOTE — Patient Instructions (Addendum)
Diabetes and Exercise  Regular exercise is important and can help:   · Control blood glucose (sugar).  · Decrease blood pressure.  ·   · Control blood lipids (cholesterol, triglycerides).  · Improve overall health.  BENEFITS FROM EXERCISE  · Improved fitness.  · Improved flexibility.  · Improved endurance.  · Increased bone density.  · Weight control.  · Increased muscle strength.  · Decreased body fat.  · Improvement of the body's use of insulin, a hormone.  · Increased insulin sensitivity.  · Reduction of insulin needs.  · Reduced stress and tension.  · Helps you feel better.  People with diabetes who add exercise to their lifestyle gain additional benefits, including:  · Weight loss.  · Reduced appetite.  · Improvement of the body's use of blood glucose.  · Decreased risk factors for heart disease:  · Lowering of cholesterol and triglycerides.  · Raising the level of good cholesterol (high-density lipoproteins, HDL).  · Lowering blood sugar.  · Decreased blood pressure.  TYPE 1 DIABETES AND EXERCISE  · Exercise will usually lower your blood glucose.  · If blood glucose is greater than 240 mg/dl, check urine ketones. If ketones are present, do not exercise.  · Location of the insulin injection sites may need to be adjusted with exercise. Avoid injecting insulin into areas of the body that will be exercised. For example, avoid injecting insulin into:  · The arms when playing tennis.  · The legs when jogging. For more information, discuss this with your caregiver.  · Keep a record of:  · Food intake.  · Type and amount of exercise.  · Expected peak times of insulin action.  · Blood glucose levels.  Do this before, during, and after exercise. Review your records with your caregiver. This will help you to develop guidelines for adjusting food intake and insulin amounts.   TYPE 2 DIABETES AND EXERCISE  · Regular physical activity can help control blood glucose.  · Exercise is important because it may:  · Increase the  body's sensitivity to insulin.  · Improve blood glucose control.  · Exercise reduces the risk of heart disease. It decreases serum cholesterol and triglycerides. It also lowers blood pressure.  · Those who take insulin or oral hypoglycemic agents should watch for signs of hypoglycemia. These signs include dizziness, shaking, sweating, chills, and confusion.  · Body water is lost during exercise. It must be replaced. This will help to avoid loss of body fluids (dehydration) or heat stroke.  Be sure to talk to your caregiver before starting an exercise program to make sure it is safe for you. Remember, any activity is better than none.   Document Released: 02/02/2004 Document Revised: 02/04/2012 Document Reviewed: 05/19/2009  Exit[employer]® Patient Information ©2013 [employer].

## 2012-12-10 ENCOUNTER — Encounter: Payer: Self-pay | Admitting: Family

## 2012-12-10 ENCOUNTER — Ambulatory Visit (INDEPENDENT_AMBULATORY_CARE_PROVIDER_SITE_OTHER): Payer: Self-pay | Admitting: Family

## 2012-12-10 VITALS — BP 148/90 | HR 75 | Wt 206.0 lb

## 2012-12-10 DIAGNOSIS — Z0000 Encounter for general adult medical examination without abnormal findings: Secondary | ICD-10-CM

## 2012-12-10 DIAGNOSIS — E119 Type 2 diabetes mellitus without complications: Secondary | ICD-10-CM

## 2012-12-10 DIAGNOSIS — I10 Essential (primary) hypertension: Secondary | ICD-10-CM

## 2012-12-10 DIAGNOSIS — E7800 Pure hypercholesterolemia, unspecified: Secondary | ICD-10-CM

## 2012-12-10 LAB — LIPID PANEL
HDL: 37.8 mg/dL — ABNORMAL LOW (ref >39.00)
LDL Cholesterol: 88 mg/dL (ref 0–99)
Total CHOL/HDL Ratio: 4
Triglycerides: 198 mg/dL — ABNORMAL HIGH (ref 0.0–149.0)

## 2012-12-10 LAB — COMPREHENSIVE METABOLIC PANEL
ALT: 35 U/L (ref 0–53)
AST: 27 U/L (ref 0–37)
Alkaline Phosphatase: 43 U/L (ref 39–117)
Calcium: 9.8 mg/dL (ref 8.4–10.5)
Chloride: 105 mEq/L (ref 96–112)
Creatinine, Ser: 0.8 mg/dL (ref 0.4–1.5)
Potassium: 4.6 mEq/L (ref 3.5–5.1)

## 2012-12-10 LAB — CBC WITH DIFFERENTIAL/PLATELET
Basophils Relative: 0.6 % (ref 0.0–3.0)
Eosinophils Absolute: 0.2 10*3/uL (ref 0.0–0.7)
Hemoglobin: 14.3 g/dL (ref 13.0–17.0)
MCHC: 33.2 g/dL (ref 30.0–36.0)
MCV: 82.4 fl (ref 78.0–100.0)
Monocytes Absolute: 0.8 10*3/uL (ref 0.1–1.0)
Neutro Abs: 4.3 10*3/uL (ref 1.4–7.7)
RBC: 5.22 Mil/uL (ref 4.22–5.81)

## 2012-12-10 NOTE — Patient Instructions (Addendum)
Diabetes and Exercise  Regular exercise is important and can help:   · Control blood glucose (sugar).  · Decrease blood pressure.  ·   · Control blood lipids (cholesterol, triglycerides).  · Improve overall health.  BENEFITS FROM EXERCISE  · Improved fitness.  · Improved flexibility.  · Improved endurance.  · Increased bone density.  · Weight control.  · Increased muscle strength.  · Decreased body fat.  · Improvement of the body's use of insulin, a hormone.  · Increased insulin sensitivity.  · Reduction of insulin needs.  · Reduced stress and tension.  · Helps you feel better.  People with diabetes who add exercise to their lifestyle gain additional benefits, including:  · Weight loss.  · Reduced appetite.  · Improvement of the body's use of blood glucose.  · Decreased risk factors for heart disease:  · Lowering of cholesterol and triglycerides.  · Raising the level of good cholesterol (high-density lipoproteins, HDL).  · Lowering blood sugar.  · Decreased blood pressure.  TYPE 1 DIABETES AND EXERCISE  · Exercise will usually lower your blood glucose.  · If blood glucose is greater than 240 mg/dl, check urine ketones. If ketones are present, do not exercise.  · Location of the insulin injection sites may need to be adjusted with exercise. Avoid injecting insulin into areas of the body that will be exercised. For example, avoid injecting insulin into:  · The arms when playing tennis.  · The legs when jogging. For more information, discuss this with your caregiver.  · Keep a record of:  · Food intake.  · Type and amount of exercise.  · Expected peak times of insulin action.  · Blood glucose levels.  Do this before, during, and after exercise. Review your records with your caregiver. This will help you to develop guidelines for adjusting food intake and insulin amounts.   TYPE 2 DIABETES AND EXERCISE  · Regular physical activity can help control blood glucose.  · Exercise is important because it may:  · Increase the  body's sensitivity to insulin.  · Improve blood glucose control.  · Exercise reduces the risk of heart disease. It decreases serum cholesterol and triglycerides. It also lowers blood pressure.  · Those who take insulin or oral hypoglycemic agents should watch for signs of hypoglycemia. These signs include dizziness, shaking, sweating, chills, and confusion.  · Body water is lost during exercise. It must be replaced. This will help to avoid loss of body fluids (dehydration) or heat stroke.  Be sure to talk to your caregiver before starting an exercise program to make sure it is safe for you. Remember, any activity is better than none.   Document Released: 02/02/2004 Document Revised: 02/04/2012 Document Reviewed: 05/19/2009  Exit[employer]® Patient Information ©2013 [employer].

## 2012-12-10 NOTE — Progress Notes (Signed)
Subjective:    Patient ID: Ronald Pierce, male    DOB: Jun 29, 1961, 52 y.o.   MRN: [id number]  HPI  Patient presents for yearly preventative medicine examination. All immunizations and health maintenance protocols were reviewed with the patient and they are up to date with these protocols. Screening laboratory values were reviewed with the patient including screening of hyperlipidemia PSA renal function and hepatic function. There medications past medical history social history problem list and allergies were reviewed in detail. Goals were established with regard to weight loss exercise diet in compliance with medications. Patient has type 2 diabetes, hyperlipidemia, hypertension general stable. Tolerating meds well. Denies any concerns.   Review of Systems  Constitutional: Negative.   HENT: Negative.   Eyes: Negative.   Respiratory: Negative.   Cardiovascular: Negative.   Gastrointestinal: Negative.   Genitourinary: Negative.   Musculoskeletal: Negative.   Skin: Negative.   Neurological: Negative.   Hematological: Negative.   Psychiatric/Behavioral: Negative.    Past Medical History  Diagnosis Date  . Diabetes mellitus without complication   . Hyperlipidemia   . Hypertension     History   Social History  . Marital Status: Married    Spouse Name: N/A    Number of Children: N/A  . Years of Education: N/A   Occupational History  . Not on file.   Social History Main Topics  . Smoking status: Former Smoker    Types: Cigarettes    Quit date: 11/21/2012  . Smokeless tobacco: Not on file  . Alcohol Use: Yes  . Drug Use: No  . Sexually Active: Not on file   Other Topics Concern  . Not on file   Social History Narrative  . No narrative on file    Past Surgical History  Procedure Date  . Wrist surgery     Family History  Problem Relation Age of Onset  . Diabetes Mother   . Hypertension Mother   . Diabetes Maternal Grandmother   . Hypertension Maternal  Grandmother   . Diabetes Maternal Grandfather   . Hypertension Maternal Grandfather     No Known Allergies  Current Outpatient Prescriptions on File Prior to Visit  Medication Sig Dispense Refill  . enalapril (VASOTEC) 5 MG tablet Take 1 tablet (5 mg total) by mouth daily.  30 tablet  3  . metFORMIN (GLUMETZA) 500 MG (MOD) 24 hr tablet Take 1 tablet (500 mg total) by mouth daily with breakfast.  30 tablet  3  . pravastatin (PRAVACHOL) 20 MG tablet Take 1 tablet (20 mg total) by mouth daily.  30 tablet  3    BP 148/90  Pulse 75  Wt 206 lb (93.441 kg)  SpO2 98%chart    Objective:   Physical Exam  Constitutional: He is oriented to person, place, and time. He appears well-developed and well-nourished.  HENT:  Head: Normocephalic and atraumatic.  Right Ear: External ear normal.  Left Ear: External ear normal.  Nose: Nose normal.  Mouth/Throat: Oropharynx is clear and moist.  Eyes: Conjunctivae normal and EOM are normal. Pupils are equal, round, and reactive to light.  Neck: Normal range of motion. Neck supple. No thyromegaly present.  Cardiovascular: Normal rate, regular rhythm and normal heart sounds.  Exam reveals no gallop and no friction rub.   No murmur heard. Pulmonary/Chest: Effort normal and breath sounds normal.  Abdominal: Soft. Bowel sounds are normal.  Genitourinary: Rectum normal, prostate normal and penis normal. Guaiac negative stool. No penile tenderness.  Musculoskeletal: Normal range  of motion.  Neurological: He is alert and oriented to person, place, and time. He has normal reflexes.  Skin: Skin is warm and dry.  Psychiatric: He has a normal mood and affect.          Assessment & Plan:  Assessment: Preventative Health Care, Type 2 diabetes, Hypertension, hyperlipidemia  Plan: Lab sent to include CMP, lipids, CBC, PSA, A1c will notify patient pending results. Continue current medications. Encouraged healthy diet and exercise. Patient call the office with  any questions or concerns. Recheck in 4 months and sooner as needed.

## 2013-01-23 ENCOUNTER — Other Ambulatory Visit: Payer: Self-pay | Admitting: Family

## 2013-02-26 ENCOUNTER — Other Ambulatory Visit: Payer: Self-pay

## 2013-02-26 MED ORDER — ENALAPRIL MALEATE 5 MG PO TABS: 5.0000 mg | ORAL_TABLET | Freq: Every day | ORAL | Status: DC

## 2013-02-26 MED ORDER — METFORMIN HCL ER 500 MG PO TB24: 500.0000 mg | ORAL_TABLET | Freq: Every day | ORAL | Status: DC

## 2013-02-27 ENCOUNTER — Other Ambulatory Visit: Payer: Self-pay | Admitting: Family

## 2013-03-02 ENCOUNTER — Other Ambulatory Visit: Payer: Self-pay

## 2013-03-02 MED ORDER — PRAVASTATIN SODIUM 20 MG PO TABS: 20.0000 mg | ORAL_TABLET | Freq: Every day | ORAL | Status: DC

## 2013-06-04 ENCOUNTER — Other Ambulatory Visit: Payer: Self-pay | Admitting: Family

## 2013-06-08 ENCOUNTER — Other Ambulatory Visit: Payer: Self-pay

## 2013-06-08 DIAGNOSIS — E7800 Pure hypercholesterolemia, unspecified: Secondary | ICD-10-CM

## 2013-06-08 DIAGNOSIS — E1165 Type 2 diabetes mellitus with hyperglycemia: Secondary | ICD-10-CM

## 2013-09-04 ENCOUNTER — Other Ambulatory Visit: Payer: Self-pay | Admitting: Family

## 2013-09-09 ENCOUNTER — Encounter: Payer: Self-pay | Admitting: Family

## 2013-09-09 ENCOUNTER — Ambulatory Visit (INDEPENDENT_AMBULATORY_CARE_PROVIDER_SITE_OTHER): Payer: Self-pay | Admitting: Family

## 2013-09-09 VITALS — BP 140/80 | HR 77 | Wt 210.0 lb

## 2013-09-09 DIAGNOSIS — E1165 Type 2 diabetes mellitus with hyperglycemia: Secondary | ICD-10-CM

## 2013-09-09 DIAGNOSIS — IMO0001 Reserved for inherently not codable concepts without codable children: Secondary | ICD-10-CM

## 2013-09-09 DIAGNOSIS — E7800 Pure hypercholesterolemia, unspecified: Secondary | ICD-10-CM

## 2013-09-09 DIAGNOSIS — I10 Essential (primary) hypertension: Secondary | ICD-10-CM

## 2013-09-09 LAB — LDL CHOLESTEROL, DIRECT: Direct LDL: 85 mg/dL

## 2013-09-09 LAB — COMPREHENSIVE METABOLIC PANEL
ALT: 44 U/L (ref 0–53)
AST: 32 U/L (ref 0–37)
Albumin: 4.1 g/dL (ref 3.5–5.2)
Alkaline Phosphatase: 43 U/L (ref 39–117)
Glucose, Bld: 149 mg/dL — ABNORMAL HIGH (ref 70–99)
Potassium: 4.2 mEq/L (ref 3.5–5.1)
Sodium: 138 mEq/L (ref 135–145)
Total Bilirubin: 0.3 mg/dL (ref 0.3–1.2)
Total Protein: 8.2 g/dL (ref 6.0–8.3)

## 2013-09-09 LAB — LIPID PANEL
HDL: 46.3 mg/dL (ref >39.00)
Total CHOL/HDL Ratio: 4
Triglycerides: 221 mg/dL — ABNORMAL HIGH (ref 0.0–149.0)

## 2013-09-09 MED ORDER — TADALAFIL 20 MG PO TABS: 10.0000 mg | ORAL_TABLET | ORAL | Status: DC | PRN

## 2013-09-09 NOTE — Patient Instructions (Signed)

## 2013-09-09 NOTE — Progress Notes (Signed)
Subjective:    Patient ID: Ronald Pierce, male    DOB: 06/15/1961, 52 y.o.   MRN: [id number]  HPI  52 year old [place] American male, is in for recheck of hypertension, type 2 diabetes, hyperlipidemia, obesity, and history of tobacco abuse. Is not currently smoking. Reports he is doing well overall. Has concerns of erectile dysfunction. Reports difficulty maintaining an erection. Has not taken any medication in the past.  Review of Systems  Constitutional: Negative.   HENT: Negative.   Respiratory: Negative.   Cardiovascular: Negative.   Gastrointestinal: Negative.   Endocrine: Negative.   Genitourinary: Negative.   Musculoskeletal: Negative.   Skin: Negative.   Neurological: Negative.   Hematological: Negative.   Psychiatric/Behavioral: Negative.    Past Medical History  Diagnosis Date  . Diabetes mellitus without complication   . Hyperlipidemia   . Hypertension     History   Social History  . Marital Status: Married    Spouse Name: N/A    Number of Children: N/A  . Years of Education: N/A   Occupational History  . Not on file.   Social History Main Topics  . Smoking status: Former Smoker    Types: Cigarettes    Quit date: 11/21/2012  . Smokeless tobacco: Not on file  . Alcohol Use: Yes  . Drug Use: No  . Sexual Activity: Not on file   Other Topics Concern  . Not on file   Social History Narrative  . No narrative on file    Past Surgical History  Procedure Laterality Date  . Wrist surgery      Family History  Problem Relation Age of Onset  . Diabetes Mother   . Hypertension Mother   . Diabetes Maternal Grandmother   . Hypertension Maternal Grandmother   . Diabetes Maternal Grandfather   . Hypertension Maternal Grandfather     No Known Allergies  Current Outpatient Prescriptions on File Prior to Visit  Medication Sig Dispense Refill  . enalapril (VASOTEC) 5 MG tablet TAKE ONE TABLET BY MOUTH ONCE DAILY (NEEDS  LAB  APPOINTMENT)  90 tablet   0  . metFORMIN (GLUCOPHAGE-XR) 500 MG 24 hr tablet TAKE ONE TABLET BY MOUTH ONCE DAILY WITH  BREAKFAST  (NEEDS  LAB  APPOINTMENT)  90 tablet  0  . pravastatin (PRAVACHOL) 20 MG tablet Take 1 tablet (20 mg total) by mouth daily.  30 tablet  3   No current facility-administered medications on file prior to visit.    BP 140/80  Pulse 77  Wt 210 lb (95.255 kg)  BMI 30.13 kg/m2chart    Objective:   Physical Exam  Constitutional: He appears well-developed and well-nourished.  HENT:  Right Ear: External ear normal.  Left Ear: External ear normal.  Nose: Nose normal.  Mouth/Throat: Oropharynx is clear and moist.  Neck: Normal range of motion. Neck supple.  Cardiovascular: Normal rate, regular rhythm and normal heart sounds.   Pulmonary/Chest: Effort normal and breath sounds normal.  Abdominal: Soft. Bowel sounds are normal. He exhibits no distension. There is no tenderness. There is no rebound.  Musculoskeletal: Normal range of motion. He exhibits no edema and no tenderness.  Neurological: He is alert. He has normal reflexes.  Skin: Skin is warm and dry.  Psychiatric: He has a normal mood and affect.          Assessment & Plan:  Assessment: 1. Type 2 diabetes-uncontrolled 2. Hypertension 3. Obesity 4. Hypercholesterolemia 5. Erectile dysfunction  Plan: Congratulated on smoking cessation Encouraged  healthy diet, exercise, weight reduction. Sample of Cialis 20 mg given and stendra 100mg . patient will call back and let us know which one works more effectively for him. Do not take any nitrates for chest pain

## 2013-09-14 ENCOUNTER — Other Ambulatory Visit: Payer: Self-pay | Admitting: Family

## 2013-09-14 MED ORDER — METFORMIN HCL ER (OSM) 1000 MG PO TB24: ORAL_TABLET | ORAL | Status: DC

## 2013-09-21 ENCOUNTER — Telehealth: Payer: Self-pay | Admitting: Family

## 2013-09-21 MED ORDER — METFORMIN HCL 1000 MG PO TABS: 1000.0000 mg | ORAL_TABLET | Freq: Every day | ORAL | Status: DC

## 2013-09-21 NOTE — Telephone Encounter (Signed)
Pt says that his Metformin has recently been rewritten from 500 mg to 1000 mg. The 1000 mg is $500+ and he cannot afford that.  Pt wants to know can he take 2 500mg  tablets? Please advise.

## 2013-09-21 NOTE — Telephone Encounter (Signed)
Rx corrected and sent to pharmacy. Pt aware Rx is $6

## 2013-10-02 ENCOUNTER — Telehealth: Payer: Self-pay | Admitting: Family

## 2013-10-02 NOTE — Telephone Encounter (Signed)
Spoke with pt about generic cialis. Pt aware that there is no generic.

## 2013-10-02 NOTE — Telephone Encounter (Signed)
Pt would like you to call him. He has a question and refused to share.

## 2013-10-09 ENCOUNTER — Other Ambulatory Visit: Payer: Self-pay | Admitting: Family

## 2013-12-11 ENCOUNTER — Other Ambulatory Visit: Payer: Self-pay | Admitting: Family

## 2014-01-08 ENCOUNTER — Other Ambulatory Visit: Payer: Self-pay | Admitting: Family

## 2014-01-29 ENCOUNTER — Other Ambulatory Visit: Payer: Self-pay | Admitting: Family

## 2014-03-10 ENCOUNTER — Telehealth: Payer: Self-pay | Admitting: Family

## 2014-03-10 ENCOUNTER — Ambulatory Visit (INDEPENDENT_AMBULATORY_CARE_PROVIDER_SITE_OTHER): Payer: Managed Care, Other (non HMO) | Admitting: Family

## 2014-03-10 ENCOUNTER — Encounter: Payer: Self-pay | Admitting: Family

## 2014-03-10 VITALS — BP 134/80 | HR 75 | Wt 200.0 lb

## 2014-03-10 DIAGNOSIS — E1165 Type 2 diabetes mellitus with hyperglycemia: Principal | ICD-10-CM

## 2014-03-10 DIAGNOSIS — IMO0001 Reserved for inherently not codable concepts without codable children: Secondary | ICD-10-CM

## 2014-03-10 DIAGNOSIS — I10 Essential (primary) hypertension: Secondary | ICD-10-CM

## 2014-03-10 DIAGNOSIS — E782 Mixed hyperlipidemia: Secondary | ICD-10-CM

## 2014-03-10 LAB — COMPREHENSIVE METABOLIC PANEL
ALT: 36 U/L (ref 0–53)
AST: 29 U/L (ref 0–37)
Albumin: 4.1 g/dL (ref 3.5–5.2)
Alkaline Phosphatase: 39 U/L (ref 39–117)
BILIRUBIN TOTAL: 0.6 mg/dL (ref 0.3–1.2)
BUN: 11 mg/dL (ref 6–23)
CO2: 27 mEq/L (ref 19–32)
Calcium: 9.6 mg/dL (ref 8.4–10.5)
Chloride: 103 mEq/L (ref 96–112)
Creatinine, Ser: 0.9 mg/dL (ref 0.4–1.5)
GFR: 120.37 mL/min (ref >60.00)
GLUCOSE: 105 mg/dL — AB (ref 70–99)
Potassium: 3.9 mEq/L (ref 3.5–5.1)
SODIUM: 137 meq/L (ref 135–145)
TOTAL PROTEIN: 8.2 g/dL (ref 6.0–8.3)

## 2014-03-10 LAB — MICROALBUMIN / CREATININE URINE RATIO
Creatinine,U: 85.3 mg/dL
MICROALB UR: 4.9 mg/dL — AB (ref 0.0–1.9)
Microalb Creat Ratio: 5.7 mg/g (ref 0.0–30.0)

## 2014-03-10 LAB — LIPID PANEL
CHOL/HDL RATIO: 3
Cholesterol: 139 mg/dL (ref 0–200)
HDL: 48.2 mg/dL (ref >39.00)
LDL CALC: 69 mg/dL (ref 0–99)
Triglycerides: 107 mg/dL (ref 0.0–149.0)
VLDL: 21.4 mg/dL (ref 0.0–40.0)

## 2014-03-10 LAB — HEMOGLOBIN A1C: Hgb A1c MFr Bld: 6.9 % — ABNORMAL HIGH (ref 4.6–6.5)

## 2014-03-10 MED ORDER — ENALAPRIL MALEATE 5 MG PO TABS: 5.0000 mg | ORAL_TABLET | Freq: Every day | ORAL | Status: DC

## 2014-03-10 NOTE — Patient Instructions (Signed)

## 2014-03-10 NOTE — Progress Notes (Signed)
Pre visit review using our clinic review tool, if applicable. No additional management support is needed unless otherwise documented below in the visit note. 

## 2014-03-10 NOTE — Progress Notes (Signed)
Subjective:    Patient ID: Ronald Pierce, male    DOB: [birth year], 53 y.o.   MRN: [id number]  HPI 53 year old African American male, smoker is in today for recheck of type 2 diabetes-uncontrolled, hypertension, hypercholesterolemia, and erectile dysfunction. Reports he's been doing well. Is working 2 jobs. Not currently exercising. Will like to come off of metformin if possible. Is not routinely checking his blood sugars. Despite being told, he has not had a diabetic eye exam nor colonoscopy. Wishes to wait until the fall to have a colonoscopy.   Review of Systems  Constitutional: Negative.   HENT: Negative.   Eyes: Negative.   Respiratory: Negative.   Cardiovascular: Negative.   Gastrointestinal: Negative.   Endocrine: Negative.   Genitourinary: Negative.   Musculoskeletal: Negative.   Skin: Negative.   Allergic/Immunologic: Negative.   Neurological: Negative.   Hematological: Negative.   Psychiatric/Behavioral: Negative.    Past Medical History  Diagnosis Date  . Diabetes mellitus without complication   . Hyperlipidemia   . Hypertension     History   Social History  . Marital Status: Married    Spouse Name: N/A    Number of Children: N/A  . Years of Education: N/A   Occupational History  . Not on file.   Social History Main Topics  . Smoking status: Former Smoker    Types: Cigarettes    Quit date: 11/21/2012  . Smokeless tobacco: Not on file  . Alcohol Use: Yes  . Drug Use: No  . Sexual Activity: Not on file   Other Topics Concern  . Not on file   Social History Narrative  . No narrative on file    Past Surgical History  Procedure Laterality Date  . Wrist surgery      Family History  Problem Relation Age of Onset  . Diabetes Mother   . Hypertension Mother   . Diabetes Maternal Grandmother   . Hypertension Maternal Grandmother   . Diabetes Maternal Grandfather   . Hypertension Maternal Grandfather     No Known Allergies  Current  Outpatient Prescriptions on File Prior to Visit  Medication Sig Dispense Refill  . metFORMIN (GLUCOPHAGE) 1000 MG tablet TAKE ONE TABLET BY MOUTH ONCE DAILY WITH  BREAKFAST  90 tablet  0  . pravastatin (PRAVACHOL) 20 MG tablet Take 1 tablet (20 mg total) by mouth daily.  30 tablet  3  . pravastatin (PRAVACHOL) 20 MG tablet TAKE ONE TABLET BY MOUTH ONCE DAILY  90 tablet  0  . tadalafil (CIALIS) 20 MG tablet Take 0.5-1 tablets (10-20 mg total) by mouth every other day as needed for erectile dysfunction.  3 tablet  0   No current facility-administered medications on file prior to visit.    BP 134/80  Pulse 75  Wt 200 lb (90.719 kg)chart    Objective:   Physical Exam  Constitutional: He is oriented to person, place, and time. He appears well-developed and well-nourished.  Neck: Normal range of motion. Neck supple.  Cardiovascular: Normal rate, regular rhythm and normal heart sounds.   Pulmonary/Chest: Effort normal and breath sounds normal.  Abdominal: Soft.  Musculoskeletal: Normal range of motion.  Neurological: He is alert and oriented to person, place, and time.  Skin: Skin is warm and dry.  Psychiatric: He has a normal mood and affect.          Assessment & Plan:  Stacie was seen today for follow-up.  Diagnoses and associated orders for this visit:  Type  II or unspecified type diabetes mellitus without mention of complication, uncontrolled - Hemoglobin A1c - Microalbumin/Creatinine Ratio, Urine  Mixed hyperlipidemia - Lipid Panel - CMP  Essential hypertension, benign - CMP  Other Orders - enalapril (VASOTEC) 5 MG tablet; Take 1 tablet (5 mg total) by mouth daily.   Call the office with any questions or concerns.Advised diabetic eye exam and nightly foot exam.

## 2014-03-10 NOTE — Telephone Encounter (Signed)
Relevant patient education assigned to patient using Emmi. ° °

## 2014-03-23 ENCOUNTER — Telehealth: Payer: Self-pay

## 2014-03-23 NOTE — Telephone Encounter (Signed)
Relevant patient education mailed to patient.  

## 2014-04-12 LAB — HM DIABETES EYE EXAM

## 2014-04-16 ENCOUNTER — Other Ambulatory Visit: Payer: Self-pay | Admitting: Family

## 2014-04-26 ENCOUNTER — Encounter: Payer: Self-pay | Admitting: Family

## 2014-05-02 ENCOUNTER — Other Ambulatory Visit: Payer: Self-pay | Admitting: Family

## 2014-06-18 ENCOUNTER — Other Ambulatory Visit: Payer: Self-pay | Admitting: Family

## 2014-08-11 ENCOUNTER — Other Ambulatory Visit: Payer: Self-pay | Admitting: Family

## 2014-09-20 ENCOUNTER — Other Ambulatory Visit: Payer: Self-pay | Admitting: Family

## 2014-10-24 ENCOUNTER — Other Ambulatory Visit: Payer: Self-pay | Admitting: Family

## 2014-10-25 ENCOUNTER — Other Ambulatory Visit: Payer: Self-pay

## 2014-10-25 MED ORDER — PRAVASTATIN SODIUM 20 MG PO TABS: 20.0000 mg | ORAL_TABLET | Freq: Every day | ORAL | Status: DC

## 2014-11-07 ENCOUNTER — Other Ambulatory Visit: Payer: Self-pay | Admitting: Family

## 2014-12-06 ENCOUNTER — Other Ambulatory Visit: Payer: Self-pay

## 2014-12-06 ENCOUNTER — Telehealth: Payer: Self-pay | Admitting: Family

## 2014-12-06 DIAGNOSIS — E1165 Type 2 diabetes mellitus with hyperglycemia: Secondary | ICD-10-CM

## 2014-12-06 DIAGNOSIS — IMO0002 Reserved for concepts with insufficient information to code with codable children: Secondary | ICD-10-CM

## 2014-12-06 DIAGNOSIS — Z0000 Encounter for general adult medical examination without abnormal findings: Secondary | ICD-10-CM

## 2014-12-06 NOTE — Telephone Encounter (Signed)
Appointment scheduled. Pt may call back to reschedule

## 2014-12-06 NOTE — Telephone Encounter (Signed)
Pt would like a cpe on a Friday. Pt has rx enalapril (VASOTEC) 5 MG tablet and it does states he needs a physical.  pls advise.

## 2014-12-19 ENCOUNTER — Other Ambulatory Visit: Payer: Self-pay | Admitting: Family

## 2014-12-24 ENCOUNTER — Ambulatory Visit (INDEPENDENT_AMBULATORY_CARE_PROVIDER_SITE_OTHER): Payer: Managed Care, Other (non HMO) | Admitting: Family

## 2014-12-24 ENCOUNTER — Other Ambulatory Visit: Payer: Managed Care, Other (non HMO)

## 2014-12-24 ENCOUNTER — Other Ambulatory Visit: Payer: Self-pay | Admitting: Family

## 2014-12-24 ENCOUNTER — Encounter: Payer: Self-pay | Admitting: Family

## 2014-12-24 DIAGNOSIS — E7800 Pure hypercholesterolemia, unspecified: Secondary | ICD-10-CM

## 2014-12-24 DIAGNOSIS — I10 Essential (primary) hypertension: Secondary | ICD-10-CM

## 2014-12-24 DIAGNOSIS — Z125 Encounter for screening for malignant neoplasm of prostate: Secondary | ICD-10-CM

## 2014-12-24 DIAGNOSIS — E1165 Type 2 diabetes mellitus with hyperglycemia: Secondary | ICD-10-CM | POA: Diagnosis not present

## 2014-12-24 DIAGNOSIS — Z23 Encounter for immunization: Secondary | ICD-10-CM

## 2014-12-24 DIAGNOSIS — IMO0002 Reserved for concepts with insufficient information to code with codable children: Secondary | ICD-10-CM

## 2014-12-24 DIAGNOSIS — Z0000 Encounter for general adult medical examination without abnormal findings: Secondary | ICD-10-CM | POA: Diagnosis not present

## 2014-12-24 LAB — POCT URINALYSIS DIPSTICK
Bilirubin, UA: NEGATIVE
Blood, UA: NEGATIVE
GLUCOSE UA: NEGATIVE
KETONES UA: NEGATIVE
LEUKOCYTES UA: NEGATIVE
Nitrite, UA: NEGATIVE
Spec Grav, UA: 1.025
Urobilinogen, UA: 0.2
pH, UA: 5.5

## 2014-12-24 LAB — COMPREHENSIVE METABOLIC PANEL
ALBUMIN: 4.2 g/dL (ref 3.5–5.2)
ALK PHOS: 45 U/L (ref 39–117)
ALT: 45 U/L (ref 0–53)
AST: 28 U/L (ref 0–37)
BUN: 13 mg/dL (ref 6–23)
CHLORIDE: 105 meq/L (ref 96–112)
CO2: 26 mEq/L (ref 19–32)
CREATININE: 0.87 mg/dL (ref 0.40–1.50)
Calcium: 9.8 mg/dL (ref 8.4–10.5)
GFR: 116.84 mL/min (ref >60.00)
Glucose, Bld: 136 mg/dL — ABNORMAL HIGH (ref 70–99)
POTASSIUM: 4.7 meq/L (ref 3.5–5.1)
Sodium: 140 mEq/L (ref 135–145)
Total Bilirubin: 0.3 mg/dL (ref 0.2–1.2)
Total Protein: 8 g/dL (ref 6.0–8.3)

## 2014-12-24 LAB — CBC WITH DIFFERENTIAL/PLATELET
BASOS ABS: 0 10*3/uL (ref 0.0–0.1)
Basophils Relative: 0.4 % (ref 0.0–3.0)
Eosinophils Absolute: 0.2 10*3/uL (ref 0.0–0.7)
Eosinophils Relative: 3.1 % (ref 0.0–5.0)
HEMATOCRIT: 42 % (ref 39.0–52.0)
Hemoglobin: 14 g/dL (ref 13.0–17.0)
Lymphocytes Relative: 26.2 % (ref 12.0–46.0)
Lymphs Abs: 1.7 10*3/uL (ref 0.7–4.0)
MCHC: 33.4 g/dL (ref 30.0–36.0)
MCV: 82.2 fl (ref 78.0–100.0)
Monocytes Absolute: 0.6 10*3/uL (ref 0.1–1.0)
Monocytes Relative: 9.9 % (ref 3.0–12.0)
Neutro Abs: 3.9 10*3/uL (ref 1.4–7.7)
Neutrophils Relative %: 60.4 % (ref 43.0–77.0)
PLATELETS: 401 10*3/uL — AB (ref 150.0–400.0)
RBC: 5.11 Mil/uL (ref 4.22–5.81)
RDW: 14 % (ref 11.5–15.5)
WBC: 6.5 10*3/uL (ref 4.0–10.5)

## 2014-12-24 LAB — LIPID PANEL
Cholesterol: 185 mg/dL (ref 0–200)
HDL: 47.7 mg/dL (ref >39.00)
LDL Cholesterol: 102 mg/dL — ABNORMAL HIGH (ref 0–99)
NonHDL: 137.3
Total CHOL/HDL Ratio: 4
Triglycerides: 178 mg/dL — ABNORMAL HIGH (ref 0.0–149.0)
VLDL: 35.6 mg/dL (ref 0.0–40.0)

## 2014-12-24 LAB — MICROALBUMIN / CREATININE URINE RATIO
Creatinine,U: 134.7 mg/dL
MICROALB UR: 17.4 mg/dL — AB (ref 0.0–1.9)
Microalb Creat Ratio: 12.9 mg/g (ref 0.0–30.0)

## 2014-12-24 LAB — PSA: PSA: 0.77 ng/mL (ref 0.10–4.00)

## 2014-12-24 LAB — HEPATIC FUNCTION PANEL
ALT: 45 U/L (ref 0–53)
AST: 28 U/L (ref 0–37)
Albumin: 4.2 g/dL (ref 3.5–5.2)
Alkaline Phosphatase: 45 U/L (ref 39–117)
BILIRUBIN DIRECT: 0.1 mg/dL (ref 0.0–0.3)
Total Bilirubin: 0.3 mg/dL (ref 0.2–1.2)
Total Protein: 8 g/dL (ref 6.0–8.3)

## 2014-12-24 LAB — TSH: TSH: 1.32 u[IU]/mL (ref 0.35–4.50)

## 2014-12-24 LAB — HEMOGLOBIN A1C: HEMOGLOBIN A1C: 7.7 % — AB (ref 4.6–6.5)

## 2014-12-24 MED ORDER — SITAGLIPTIN PHOSPHATE 50 MG PO TABS: 50.0000 mg | ORAL_TABLET | Freq: Every day | ORAL | Status: DC

## 2014-12-24 NOTE — Patient Instructions (Signed)
Diabetes and Exercise Exercising regularly is important. It is not just about losing weight. It has many health benefits, such as:  Improving your overall fitness, flexibility, and endurance.  Increasing your bone density.  Helping with weight control.  Decreasing your body fat.  Increasing your muscle strength.  Reducing stress and tension.  Improving your overall health. People with diabetes who exercise gain additional benefits because exercise:  Reduces appetite.  Improves the body's use of blood sugar (glucose).  Helps lower or control blood glucose.  Decreases blood pressure.  Helps control blood lipids (such as cholesterol and triglycerides).  Improves the body's use of the hormone insulin by:  Increasing the body's insulin sensitivity.  Reducing the body's insulin needs.  Decreases the risk for heart disease because exercising:  Lowers cholesterol and triglycerides levels.  Increases the levels of good cholesterol (such as high-density lipoproteins [HDL]) in the body.  Lowers blood glucose levels. YOUR ACTIVITY PLAN  Choose an activity that you enjoy and set realistic goals. Your health care provider or diabetes educator can help you make an activity plan that works for you. Exercise regularly as directed by your health care provider. This includes:  Performing resistance training twice a week such as push-ups, sit-ups, lifting weights, or using resistance bands.  Performing 150 minutes of cardio exercises each week such as walking, running, or playing sports.  Staying active and spending no more than 90 minutes at one time being inactive. Even short bursts of exercise are good for you. Three 10-minute sessions spread throughout the day are just as beneficial as a single 30-minute session. Some exercise ideas include:  Taking the dog for a walk.  Taking the stairs instead of the elevator.  Dancing to your favorite song.  Doing an exercise  video.  Doing your favorite exercise with a friend. RECOMMENDATIONS FOR EXERCISING WITH TYPE 1 OR TYPE 2 DIABETES   Check your blood glucose before exercising. If blood glucose levels are greater than 240 mg/dL, check for urine ketones. Do not exercise if ketones are present.  Avoid injecting insulin into areas of the body that are going to be exercised. For example, avoid injecting insulin into:  The arms when playing tennis.  The legs when jogging.  Keep a record of:  Food intake before and after you exercise.  Expected peak times of insulin action.  Blood glucose levels before and after you exercise.  The type and amount of exercise you have done.  Review your records with your health care provider. Your health care provider will help you to develop guidelines for adjusting food intake and insulin amounts before and after exercising.  If you take insulin or oral hypoglycemic agents, watch for signs and symptoms of hypoglycemia. They include:  Dizziness.  Shaking.  Sweating.  Chills.  Confusion.  Drink plenty of water while you exercise to prevent dehydration or heat stroke. Body water is lost during exercise and must be replaced.  Talk to your health care provider before starting an exercise program to make sure it is safe for you. Remember, almost any type of activity is better than none. Document Released: 02/02/2004 Document Revised: 03/29/2014 Document Reviewed: 04/21/2013 ExitCare Patient Information 2015 [employer]. This information is not intended to replace advice given to you by your health care provider. Make sure you discuss any questions you have with your health care provider.  

## 2014-12-24 NOTE — Progress Notes (Signed)
Subjective:    Patient ID: Ronald Pierce, male    DOB: 06/21/1961, 54 y.o.   MRN: [id number]  HPI 1 yr., AA, male seen today for a comprehensive physical exam.  Pt reports not regularly checking blood sugar or blood pressure.  He checked blood sugar last week obtained a reading of 170.  Admits to drinking a few beers daily.  Denies numbness or tingling in extremities.  Reports obtaining yearly visual exams.  Patient reports no issues or concerns  at present.  All immunizations and health maintenance protocols were reviewed with the patient and needed orders were placed.  Appropriate screening laboratory values were ordered for the patient including screening of hyperlipidemia, renal function and hepatic function. If indicated by BPH, a PSA was ordered.  Medication reconciliation,  past medical history, social history, problem list and allergies were reviewed in detail with the patient  Goals were established with regard to weight loss, exercise, and  diet in compliance with medications      Review of Systems  Constitutional: Negative.   HENT: Negative.   Eyes: Negative.   Respiratory: Negative.   Cardiovascular: Negative.   Gastrointestinal: Negative.   Endocrine: Negative.   Genitourinary: Negative.   Musculoskeletal: Negative.   Skin: Negative.   Allergic/Immunologic: Negative.   Neurological: Negative.   Hematological: Negative.   Psychiatric/Behavioral: Negative.    Past Medical History  Diagnosis Date  . Diabetes mellitus without complication   . Hyperlipidemia   . Hypertension     History   Social History  . Marital Status: Married    Spouse Name: N/A    Number of Children: N/A  . Years of Education: N/A   Occupational History  . Not on file.   Social History Main Topics  . Smoking status: Former Smoker    Types: Cigarettes    Quit date: 11/21/2012  . Smokeless tobacco: Not on file  . Alcohol Use: Yes  . Drug Use: No  . Sexual Activity: Not on  file   Other Topics Concern  . Not on file   Social History Narrative  . No narrative on file    Past Surgical History  Procedure Laterality Date  . Wrist surgery      Family History  Problem Relation Age of Onset  . Diabetes Mother   . Hypertension Mother   . Diabetes Maternal Grandmother   . Hypertension Maternal Grandmother   . Diabetes Maternal Grandfather   . Hypertension Maternal Grandfather     No Known Allergies    There were no vitals taken for this visit.chart    Objective:   Physical Exam  Constitutional: He is oriented to person, place, and time. He appears well-developed and well-nourished.  HENT:  Head: Normocephalic and atraumatic.  Right Ear: External ear normal.  Left Ear: External ear normal.  Nose: Nose normal.  Mouth/Throat: Oropharynx is clear and moist.  Eyes: Conjunctivae and EOM are normal. Pupils are equal, round, and reactive to light.  Lipid deposits present bilaterally.  Neck: Normal range of motion. Neck supple. No thyromegaly present.  Cardiovascular: Normal rate, regular rhythm and normal heart sounds.   Pulmonary/Chest: Effort normal and breath sounds normal.  Abdominal: Soft. Bowel sounds are normal.  Genitourinary: Rectum normal and prostate normal.  Musculoskeletal: Normal range of motion.  Neurological: He is alert and oriented to person, place, and time.  Skin: Skin is warm and dry.  Psychiatric: He has a normal mood and affect.  Assessment & Plan:  Ronald Pierce was seen today for no specified reason.  Diagnoses and associated orders for this visit:  Routine general medical examination at a health care facility - Hepatic Function Panel - Cancel: Basic Metabolic Panel - TSH - POC Urinalysis Dipstick - Microalbumin/Creatinine Ratio, Urine - EKG 12-Lead - Ambulatory referral to Gastroenterology  Uncontrolled diabetes mellitus - CBC with Differential - Hemoglobin A1c - Lipid panel - CMP - PSA - EKG  12-Lead  Essential hypertension - EKG 12-Lead  Pure hypercholesterolemia - EKG 12-Lead   Administered pneumonia vaccine on today's visit.  Education provided regarding diabetic diet, increase exercise, and decrease alcohol daily intake. Will follow regarding lab values.

## 2014-12-31 ENCOUNTER — Encounter: Payer: Self-pay | Admitting: Internal Medicine

## 2015-01-23 ENCOUNTER — Other Ambulatory Visit: Payer: Self-pay | Admitting: Family

## 2015-02-11 ENCOUNTER — Ambulatory Visit (AMBULATORY_SURGERY_CENTER): Payer: Self-pay

## 2015-02-11 VITALS — Ht 70.0 in | Wt 214.0 lb

## 2015-02-11 DIAGNOSIS — Z1211 Encounter for screening for malignant neoplasm of colon: Secondary | ICD-10-CM

## 2015-02-11 MED ORDER — MOVIPREP 100 G PO SOLR: 1.0000 | Freq: Once | ORAL | Status: DC

## 2015-02-11 NOTE — Progress Notes (Signed)
No allergies to eggs or soy No diet/weight loss meds No home oxygen No past problems with anesthesia  No email 

## 2015-02-13 ENCOUNTER — Other Ambulatory Visit: Payer: Self-pay | Admitting: Family

## 2015-02-25 ENCOUNTER — Ambulatory Visit (AMBULATORY_SURGERY_CENTER): Payer: Managed Care, Other (non HMO) | Admitting: Internal Medicine

## 2015-02-25 ENCOUNTER — Encounter: Payer: Self-pay | Admitting: Internal Medicine

## 2015-02-25 VITALS — BP 179/101 | HR 67 | Temp 97.4°F | Resp 17 | Ht 70.0 in | Wt 214.0 lb

## 2015-02-25 DIAGNOSIS — D128 Benign neoplasm of rectum: Secondary | ICD-10-CM | POA: Diagnosis not present

## 2015-02-25 DIAGNOSIS — Z1211 Encounter for screening for malignant neoplasm of colon: Secondary | ICD-10-CM

## 2015-02-25 DIAGNOSIS — D122 Benign neoplasm of ascending colon: Secondary | ICD-10-CM | POA: Diagnosis not present

## 2015-02-25 LAB — GLUCOSE, CAPILLARY
GLUCOSE-CAPILLARY: 121 mg/dL — AB (ref 70–99)
Glucose-Capillary: 118 mg/dL — ABNORMAL HIGH (ref 70–99)

## 2015-02-25 MED ORDER — SODIUM CHLORIDE 0.9 % IV SOLN: 500.0000 mL | INTRAVENOUS | Status: DC

## 2015-02-25 NOTE — Progress Notes (Signed)
Called to room to assist during endoscopic procedure.  Patient ID and intended procedure confirmed with present staff. Received instructions for my participation in the procedure from the performing physician.  

## 2015-02-25 NOTE — Progress Notes (Signed)
Pt told to take Vasotec as soon as he gets home, understanding voiced

## 2015-02-25 NOTE — Progress Notes (Signed)
Report to PACU, RN, vss, BBS= Clear.  

## 2015-02-25 NOTE — Patient Instructions (Signed)

## 2015-02-25 NOTE — Op Note (Signed)
[place]  [address]. [place], [id number]   COLONOSCOPY PROCEDURE REPORT  PATIENT: Ronald Pierce, Ronald Pierce  MR#: [id number] BIRTHDATE: 03-Aug-1961 , 65  yrs. old GENDER: male ENDOSCOPIST: Lafayette Dragon, MD REFERRED RA:XENMMHW Megan Salon, FNP-BC PROCEDURE DATE:  02/25/2015 PROCEDURE:   Colonoscopy, screening and Colonoscopy with cold biopsy polypectomy First Screening Colonoscopy - Avg.  risk and is 50 yrs.  old or older Yes.  Prior Negative Screening - Now for repeat screening. N/A  History of Adenoma - Now for follow-up colonoscopy & has been > or = to 3 yrs.  N/A ASA CLASS:   Class II INDICATIONS:Screening for colonic neoplasia and Colorectal Neoplasm Risk Assessment for this procedure is average risk. MEDICATIONS: Monitored anesthesia care and Propofol 200 mg IV  DESCRIPTION OF PROCEDURE:   After the risks benefits and alternatives of the procedure were thoroughly explained, informed consent was obtained.  The digital rectal exam revealed no abnormalities of the rectum.   The LB [id number]  endoscope was introduced through the anus and advanced to the cecum, which was identified by both the appendix and ileocecal valve. No adverse events experienced.   The quality of the prep was good.  (MoviPrep was used)  The instrument was then slowly withdrawn as the colon was fully examined.      COLON FINDINGS: Two sessile polyps measuring 4 mm in size were found in the ascending colon.  A polypectomy was performed with cold forceps.  The resection was complete, the polyp tissue was completely retrieved and sent to histology.  Retroflexed views revealed no abnormalities. The time to cecum = 6.12 Withdrawal time = 6.31   The scope was withdrawn and the procedure completed. COMPLICATIONS: There were no immediate complications.  ENDOSCOPIC IMPRESSION: Two sessile polyps were found in the ascending colon; polypectomy was performed with cold  forceps  RECOMMENDATIONS: 1.  Await pathology results 2.  High-fiber diet Recall colonoscopy pending path report  eSigned:  Lafayette Dragon, MD 02/25/2015 8:23 AM   cc:

## 2015-02-28 ENCOUNTER — Telehealth: Payer: Self-pay

## 2015-02-28 NOTE — Telephone Encounter (Signed)
  Follow up Call-  Call back number 02/25/2015  Post procedure Call Back phone  # [phone]  Permission to leave phone message Yes     Patient questions:  Do you have a fever, pain , or abdominal swelling? No. Pain Score  0 *  Have you tolerated food without any problems? Yes.    Have you been able to return to your normal activities? Yes.    Do you have any questions about your discharge instructions: Diet   No. Medications  No. Follow up visit  No.  Do you have questions or concerns about your Care? No.  Actions: * If pain score is 4 or above: No action needed, pain <4.

## 2015-03-01 ENCOUNTER — Encounter: Payer: Self-pay | Admitting: Internal Medicine

## 2015-03-20 ENCOUNTER — Other Ambulatory Visit: Payer: Self-pay | Admitting: Family

## 2015-04-01 ENCOUNTER — Other Ambulatory Visit: Payer: Managed Care, Other (non HMO)

## 2015-07-26 ENCOUNTER — Other Ambulatory Visit: Payer: Self-pay | Admitting: Family

## 2015-09-12 ENCOUNTER — Encounter: Payer: Self-pay | Admitting: Family

## 2015-09-12 ENCOUNTER — Ambulatory Visit (INDEPENDENT_AMBULATORY_CARE_PROVIDER_SITE_OTHER): Payer: Managed Care, Other (non HMO) | Admitting: Family

## 2015-09-12 VITALS — BP 144/98 | HR 84 | Temp 98.1°F | Ht 70.0 in | Wt 208.0 lb

## 2015-09-12 DIAGNOSIS — E7800 Pure hypercholesterolemia, unspecified: Secondary | ICD-10-CM | POA: Diagnosis not present

## 2015-09-12 DIAGNOSIS — E1165 Type 2 diabetes mellitus with hyperglycemia: Secondary | ICD-10-CM | POA: Diagnosis not present

## 2015-09-12 DIAGNOSIS — M19012 Primary osteoarthritis, left shoulder: Secondary | ICD-10-CM | POA: Diagnosis not present

## 2015-09-12 DIAGNOSIS — IMO0001 Reserved for inherently not codable concepts without codable children: Secondary | ICD-10-CM

## 2015-09-12 DIAGNOSIS — I10 Essential (primary) hypertension: Secondary | ICD-10-CM | POA: Diagnosis not present

## 2015-09-12 LAB — HEPATIC FUNCTION PANEL
ALT: 40 U/L (ref 0–53)
AST: 27 U/L (ref 0–37)
Albumin: 4.2 g/dL (ref 3.5–5.2)
Alkaline Phosphatase: 46 U/L (ref 39–117)
Bilirubin, Direct: 0.1 mg/dL (ref 0.0–0.3)
Total Bilirubin: 0.3 mg/dL (ref 0.2–1.2)
Total Protein: 8.1 g/dL (ref 6.0–8.3)

## 2015-09-12 LAB — BASIC METABOLIC PANEL WITH GFR
BUN: 12 mg/dL (ref 6–23)
CO2: 28 meq/L (ref 19–32)
Calcium: 10.1 mg/dL (ref 8.4–10.5)
Chloride: 107 meq/L (ref 96–112)
Creatinine, Ser: 0.95 mg/dL (ref 0.40–1.50)
GFR: 105.29 mL/min
Glucose, Bld: 135 mg/dL — ABNORMAL HIGH (ref 70–99)
Potassium: 5.3 meq/L — ABNORMAL HIGH (ref 3.5–5.1)
Sodium: 144 meq/L (ref 135–145)

## 2015-09-12 LAB — LIPID PANEL
Cholesterol: 152 mg/dL (ref 0–200)
HDL: 46.7 mg/dL (ref >39.00)
LDL Cholesterol: 68 mg/dL (ref 0–99)
NONHDL: 105.16
Total CHOL/HDL Ratio: 3
Triglycerides: 187 mg/dL — ABNORMAL HIGH (ref 0.0–149.0)
VLDL: 37.4 mg/dL (ref 0.0–40.0)

## 2015-09-12 LAB — HEMOGLOBIN A1C: Hgb A1c MFr Bld: 7.6 % — ABNORMAL HIGH (ref 4.6–6.5)

## 2015-09-12 LAB — MICROALBUMIN / CREATININE URINE RATIO
Creatinine,U: 118.1 mg/dL
MICROALB UR: 4 mg/dL — AB (ref 0.0–1.9)
MICROALB/CREAT RATIO: 3.4 mg/g (ref 0.0–30.0)

## 2015-09-12 MED ORDER — PRAVASTATIN SODIUM 20 MG PO TABS: 20.0000 mg | ORAL_TABLET | Freq: Every day | ORAL | Status: DC

## 2015-09-12 MED ORDER — MELOXICAM 15 MG PO TABS: 15.0000 mg | ORAL_TABLET | Freq: Every day | ORAL | Status: DC

## 2015-09-12 MED ORDER — ENALAPRIL MALEATE 10 MG PO TABS: ORAL_TABLET | ORAL | Status: DC

## 2015-09-12 MED ORDER — METFORMIN HCL 1000 MG PO TABS: ORAL_TABLET | ORAL | Status: DC

## 2015-09-12 MED ORDER — ASPIRIN 81 MG PO CHEW: 81.0000 mg | CHEWABLE_TABLET | Freq: Every day | ORAL | Status: DC

## 2015-09-12 NOTE — Progress Notes (Signed)
Pre visit review using our clinic review tool, if applicable. No additional management support is needed unless otherwise documented below in the visit note. 

## 2015-09-12 NOTE — Patient Instructions (Signed)

## 2015-09-12 NOTE — Progress Notes (Signed)
Subjective:    Patient ID: Ronald Pierce, male    DOB: [birth year], 54 y.o.   MRN: [id number]  HPI  54 year old African-American male, nonsmoker with a history of hypertension, erectile dysfunction, hyperlipidemia, type 2 diabetes uncontrolled is in today for recheck. He has concerns of left shoulder pain that he rates a 5 out of 10 and describes a stiff and achy. Reports having lifting on Friday at work and doing yard work over the weekend. Pain is worse with movement and better with ibuprofen. Denies injury  Review of Systems  HENT: Negative.   Respiratory: Negative.   Cardiovascular: Negative.   Gastrointestinal: Negative.   Endocrine: Negative.   Genitourinary: Negative.   Musculoskeletal: Positive for arthralgias.       Left shoulder pain  Skin: Negative.   Allergic/Immunologic: Negative.   Neurological: Negative.   Psychiatric/Behavioral: Negative.    Past Medical History  Diagnosis Date  . Diabetes mellitus without complication ([place])   . Hyperlipidemia   . Hypertension     Social History   Social History  . Marital Status: Married    Spouse Name: N/A  . Number of Children: N/A  . Years of Education: N/A   Occupational History  . Not on file.   Social History Main Topics  . Smoking status: Former Smoker    Types: Cigarettes    Quit date: 11/21/2012  . Smokeless tobacco: Never Used  . Alcohol Use: 12.6 oz/week    0 Standard drinks or equivalent, 21 Shots of liquor per week  . Drug Use: No  . Sexual Activity: Not on file   Other Topics Concern  . Not on file   Social History Narrative    Past Surgical History  Procedure Laterality Date  . Wrist surgery    . Left forearm tendon repair      Family History  Problem Relation Age of Onset  . Diabetes Mother   . Hypertension Mother   . Diabetes Maternal Grandmother   . Hypertension Maternal Grandmother   . Diabetes Maternal Grandfather   . Hypertension Maternal Grandfather   . Colon cancer Neg  Hx   . Diabetes Father     No Known Allergies  No current outpatient prescriptions on file prior to visit.   No current facility-administered medications on file prior to visit.    BP 144/98 mmHg  Pulse 84  Temp(Src) 98.1 F (36.7 C) (Oral)  Ht 5\' 10"  (1.778 m)  Wt 208 lb (94.348 kg)  BMI 29.84 kg/m2chart    Objective:   Physical Exam  Constitutional: He is oriented to person, place, and time. He appears well-developed and well-nourished.  HENT:  Right Ear: External ear normal.  Left Ear: External ear normal.  Nose: Nose normal.  Mouth/Throat: Oropharynx is clear and moist.  Neck: Normal range of motion. Neck supple. No thyromegaly present.  Cardiovascular: Normal rate, regular rhythm and normal heart sounds.   Pulmonary/Chest: Effort normal and breath sounds normal.  Musculoskeletal: He exhibits tenderness.  Tenderness to palpation of the anterior aspect of the left shoulder. Pain with flexion and extension. Radial pulses 2 out of 2. No obvious swelling or deformity.  Neurological: He is alert and oriented to person, place, and time.  Skin: Skin is warm and dry.  Psychiatric: He has a normal mood and affect.          Assessment & Plan:  Ronald Pierce was seen today for headache and shoulder pain.  Diagnoses and all orders for this  visit:  Essential hypertension -     Basic Metabolic Panel -     Hepatic Function Panel  Pure hypercholesterolemia -     Basic Metabolic Panel -     Hepatic Function Panel -     Lipid Panel  Uncontrolled type 2 diabetes mellitus without complication, without long-term current use of insulin (HCC) -     Hemoglobin A1c -     Basic Metabolic Panel -     Hepatic Function Panel -     Microalbumin/Creatinine Ratio, Urine  Primary osteoarthritis of left shoulder  Other orders -     meloxicam (MOBIC) 15 MG tablet; Take 1 tablet (15 mg total) by mouth daily. -     aspirin 81 MG chewable tablet; Chew 1 tablet (81 mg total) by mouth daily. -      enalapril (VASOTEC) 10 MG tablet; TAKE ONE TABLET BY MOUTH ONCE DAILY -     metFORMIN (GLUCOPHAGE) 1000 MG tablet; TAKE ONE TABLET BY MOUTH ONCE DAILY WITH BREAKFAST -     pravastatin (PRAVACHOL) 20 MG tablet; Take 1 tablet (20 mg total) by mouth daily.   Advised the baby aspirin on a daily basis for cardiovascular protection. Labs obtained today will notify patient pending results. Increased dose of enalapril from 5 mg to 10 mg for better blood pressure control. DC ibuprofen A use meloxicam once a day as needed with food for left shoulder osteoarthritis. Consider an x-ray if symptoms persist. Call the office with any questions or concerns.

## 2015-12-16 ENCOUNTER — Other Ambulatory Visit: Payer: Self-pay | Admitting: Family

## 2016-01-25 ENCOUNTER — Encounter: Payer: Self-pay | Admitting: Adult Health

## 2016-01-25 ENCOUNTER — Ambulatory Visit (INDEPENDENT_AMBULATORY_CARE_PROVIDER_SITE_OTHER): Payer: Managed Care, Other (non HMO) | Admitting: Adult Health

## 2016-01-25 VITALS — BP 168/98 | Temp 98.3°F | Wt 209.0 lb

## 2016-01-25 DIAGNOSIS — M25512 Pain in left shoulder: Secondary | ICD-10-CM

## 2016-01-25 DIAGNOSIS — I10 Essential (primary) hypertension: Secondary | ICD-10-CM | POA: Diagnosis not present

## 2016-01-25 MED ORDER — ENALAPRIL MALEATE 20 MG PO TABS: ORAL_TABLET | ORAL | Status: DC

## 2016-01-25 MED ORDER — METHYLPREDNISOLONE ACETATE 80 MG/ML IJ SUSP: 120.0000 mg | Freq: Once | INTRAMUSCULAR | Status: DC

## 2016-01-25 NOTE — Addendum Note (Signed)
Addended by: Apolinar Junes on: 01/25/2016 12:42 PM   Modules accepted: Orders, SmartSet

## 2016-01-25 NOTE — Patient Instructions (Signed)
It was great meeting you today!  We injected your left shoulder with a steroid. If this does not help your discomfort then we will get an MRI. You can stop wearing your arm sling, remain on light duty until you follow up with me in one week.   I have increased your Vasotec ( Blood pressure medication) from 10 mg to 20 mg. If you feel dizzy or lightheaded please stop this medication and go back to the 10 mg  Follow up with me in one week.

## 2016-01-25 NOTE — Progress Notes (Signed)
Subjective:    Patient ID: Ronald Pierce, male    DOB: 06-May-1961, 55 y.o.   MRN: [id number]  HPI  55 year old male who presents to to the office today for left shoulder pain. He sustained an injury at work on Jan 30 th and was being seen by [facility]. [employer] was paying for his therapy. X rays were negative and he was advised to get an MRI. This is when [employer] comp stopped paying.   He continues to have left shoulder pain, mostly with certain movements. He is in a arm sling while at work and is on light duty. He does not feel like he needs to be in a sling and actually stretching the shoulder makes the arm better.   He has been given Ultram, Medrol dose pack and Ibuprofen 800 mg. Denies any improvement in the pain.   Also, his blood pressure is elevated in the office today at 168/98  He would rather not have an MRI at this time.    Review of Systems  Constitutional: Negative.   Respiratory: Negative.   Cardiovascular: Negative.   Musculoskeletal: Positive for arthralgias. Negative for myalgias, back pain, joint swelling and neck pain.  All other systems reviewed and are negative.  Past Medical History  Diagnosis Date  . Diabetes mellitus without complication ([place])   . Hyperlipidemia   . Hypertension     Social History   Social History  . Marital Status: Married    Spouse Name: N/A  . Number of Children: N/A  . Years of Education: N/A   Occupational History  . Not on file.   Social History Main Topics  . Smoking status: Former Smoker    Types: Cigarettes    Quit date: 11/21/2012  . Smokeless tobacco: Never Used  . Alcohol Use: 12.6 oz/week    0 Standard drinks or equivalent, 21 Shots of liquor per week  . Drug Use: No  . Sexual Activity: Not on file   Other Topics Concern  . Not on file   Social History Narrative    Past Surgical History  Procedure Laterality Date  . Wrist surgery    . Left forearm tendon repair      Family History    Problem Relation Age of Onset  . Diabetes Mother   . Hypertension Mother   . Diabetes Maternal Grandmother   . Hypertension Maternal Grandmother   . Diabetes Maternal Grandfather   . Hypertension Maternal Grandfather   . Colon cancer Neg Hx   . Diabetes Father     No Known Allergies  Current Outpatient Prescriptions on File Prior to Visit  Medication Sig Dispense Refill  . aspirin 81 MG chewable tablet Chew 1 tablet (81 mg total) by mouth daily. 30 tablet 0  . metFORMIN (GLUCOPHAGE) 1000 MG tablet TAKE ONE TABLET BY MOUTH ONCE DAILY WITH BREAKFAST 90 tablet 2  . pravastatin (PRAVACHOL) 20 MG tablet Take 1 tablet (20 mg total) by mouth daily. 90 tablet 2  . meloxicam (MOBIC) 15 MG tablet TAKE ONE TABLET BY MOUTH ONCE DAILY (Patient not taking: Reported on 01/25/2016) 30 tablet 0   No current facility-administered medications on file prior to visit.    BP 168/98 mmHg  Temp(Src) 98.3 F (36.8 C) (Oral)  Wt 209 lb (94.802 kg)       Objective:   Physical Exam  Constitutional: He is oriented to person, place, and time. He appears well-developed and well-nourished. No distress.  Cardiovascular:  Normal rate, regular rhythm, normal heart sounds and intact distal pulses.  Exam reveals no friction rub.   No murmur heard. Pulmonary/Chest: Effort normal and breath sounds normal. No respiratory distress. He has no wheezes. He has no rales. He exhibits no tenderness.  Musculoskeletal: He exhibits tenderness. He exhibits no edema.       Left shoulder: He exhibits decreased range of motion (slight decrease in ROM when raising hand straight over head. ), tenderness and pain. He exhibits no swelling, no effusion, no crepitus and normal strength.  Neurological: He is alert and oriented to person, place, and time.  Skin: Skin is warm and dry. No rash noted. He is not diaphoretic. No erythema. No pallor.  Psychiatric: He has a normal mood and affect. His behavior is normal. Judgment and thought  content normal.  Vitals reviewed.     Assessment & Plan:  1. Essential hypertension - enalapril (VASOTEC) 20 MG tablet; TAKE ONE TABLET BY MOUTH ONCE DAILY  Dispense: 90 tablet; Refill: 1  2. Pain in joint of left shoulder A steroid injection was performed at 9:30 am  using 1% plain Lidocaine and 120 mg of Depo Medrol. This was well tolerated. Consent was obtained prior to procedure. Area was cleaned with betadine and alcohol. Cold spray was used for anesthesia.  - methylPREDNISolone acetate (DEPO-MEDROL) injection 120 mg; Inject 1.5 mLs (120 mg total) into the articular space once.

## 2016-02-01 ENCOUNTER — Ambulatory Visit (INDEPENDENT_AMBULATORY_CARE_PROVIDER_SITE_OTHER): Payer: Managed Care, Other (non HMO) | Admitting: Adult Health

## 2016-02-01 ENCOUNTER — Encounter: Payer: Self-pay | Admitting: Adult Health

## 2016-02-01 VITALS — BP 138/84 | Temp 97.7°F | Ht 70.0 in | Wt 206.2 lb

## 2016-02-01 DIAGNOSIS — M25512 Pain in left shoulder: Secondary | ICD-10-CM | POA: Diagnosis not present

## 2016-02-01 NOTE — Progress Notes (Signed)
Subjective:    Patient ID: Ronald Pierce, male    DOB: 29-May-1961, 55 y.o.   MRN: [id number]  HPI  55 year old male who presents to the office today for one week follow up after having a steroid injection into his left shoulder. He reports that he is feeling much better, he has full ROM and only occasionally has pain in the shoulder and even then, it feels like " tenderness". He feels as though his shoulder is getting stronger since being out of the sling.    Review of Systems  Constitutional: Negative.   Respiratory: Negative.   Cardiovascular: Negative.   Musculoskeletal: Negative.   Skin: Negative.   All other systems reviewed and are negative.  Past Medical History  Diagnosis Date  . Diabetes mellitus without complication ([place])   . Hyperlipidemia   . Hypertension     Social History   Social History  . Marital Status: Married    Spouse Name: N/A  . Number of Children: N/A  . Years of Education: N/A   Occupational History  . Not on file.   Social History Main Topics  . Smoking status: Former Smoker    Types: Cigarettes    Quit date: 11/21/2012  . Smokeless tobacco: Never Used  . Alcohol Use: 12.6 oz/week    0 Standard drinks or equivalent, 21 Shots of liquor per week  . Drug Use: No  . Sexual Activity: Not on file   Other Topics Concern  . Not on file   Social History Narrative    Past Surgical History  Procedure Laterality Date  . Wrist surgery    . Left forearm tendon repair      Family History  Problem Relation Age of Onset  . Diabetes Mother   . Hypertension Mother   . Diabetes Maternal Grandmother   . Hypertension Maternal Grandmother   . Diabetes Maternal Grandfather   . Hypertension Maternal Grandfather   . Colon cancer Neg Hx   . Diabetes Father     No Known Allergies  Current Outpatient Prescriptions on File Prior to Visit  Medication Sig Dispense Refill  . aspirin 81 MG chewable tablet Chew 1 tablet (81 mg total) by mouth  daily. 30 tablet 0  . enalapril (VASOTEC) 20 MG tablet TAKE ONE TABLET BY MOUTH ONCE DAILY 90 tablet 1  . ibuprofen (ADVIL,MOTRIN) 600 MG tablet Take 600 mg by mouth every 6 (six) hours as needed.    . meloxicam (MOBIC) 15 MG tablet TAKE ONE TABLET BY MOUTH ONCE DAILY 30 tablet 0  . metFORMIN (GLUCOPHAGE) 1000 MG tablet TAKE ONE TABLET BY MOUTH ONCE DAILY WITH BREAKFAST 90 tablet 2  . pravastatin (PRAVACHOL) 20 MG tablet Take 1 tablet (20 mg total) by mouth daily. 90 tablet 2   No current facility-administered medications on file prior to visit.    BP 138/84 mmHg  Temp(Src) 97.7 F (36.5 C) (Oral)  Ht 5\' 10"  (1.778 m)  Wt 206 lb 3.2 oz (93.532 kg)  BMI 29.59 kg/m2       Objective:   Physical Exam  Constitutional: He is oriented to person, place, and time. He appears well-developed and well-nourished. No distress.  Cardiovascular: Normal rate, regular rhythm, normal heart sounds and intact distal pulses.  Exam reveals no gallop.   No murmur heard. Pulmonary/Chest: Effort normal and breath sounds normal. No respiratory distress. He has no wheezes. He has no rales. He exhibits no tenderness.  Musculoskeletal: Normal range of motion.  He exhibits edema and tenderness.       Left shoulder: He exhibits normal range of motion, no tenderness, no swelling, no crepitus, no deformity and no pain.  Neurological: He is alert and oriented to person, place, and time.  Skin: Skin is warm and dry. No rash noted. He is not diaphoretic. No erythema. No pallor.  Psychiatric: He has a normal mood and affect. His behavior is normal. Judgment and thought content normal.  Nursing note and vitals reviewed.     Assessment & Plan:  1. Shoulder pain, left - Much improved since steroid injection. I would expect his shoulder pain to continue to improve as his strength increases - Follow up as needed - Can take NSAIDS for pain relief.

## 2016-02-01 NOTE — Patient Instructions (Addendum)
It was great seeing you again today!   I am glad you are feeling better.   On your way out tonight, make an appointment to establish care. It looks like your last physical was in October.

## 2016-02-01 NOTE — Progress Notes (Signed)
Pre visit review using our clinic review tool, if applicable. No additional management support is needed unless otherwise documented below in the visit note. 

## 2016-02-07 MED ORDER — METHYLPREDNISOLONE ACETATE 80 MG/ML IJ SUSP
120.0000 mg | Freq: Once | INTRAMUSCULAR | Status: AC
  Administered 2016-01-25: 120 mg via INTRA_ARTICULAR

## 2016-02-07 NOTE — Addendum Note (Signed)
Addended by: Colleen Can on: 02/07/2016 11:50 AM   Modules accepted: Orders

## 2016-05-04 ENCOUNTER — Encounter: Payer: Self-pay | Admitting: Adult Health

## 2016-05-04 ENCOUNTER — Ambulatory Visit (INDEPENDENT_AMBULATORY_CARE_PROVIDER_SITE_OTHER): Payer: Managed Care, Other (non HMO) | Admitting: Adult Health

## 2016-05-04 VITALS — BP 170/110 | Temp 98.1°F | Ht 70.0 in | Wt 204.0 lb

## 2016-05-04 DIAGNOSIS — I10 Essential (primary) hypertension: Secondary | ICD-10-CM | POA: Diagnosis not present

## 2016-05-04 DIAGNOSIS — E1100 Type 2 diabetes mellitus with hyperosmolarity without nonketotic hyperglycemic-hyperosmolar coma (NKHHC): Secondary | ICD-10-CM | POA: Diagnosis not present

## 2016-05-04 DIAGNOSIS — Z7189 Other specified counseling: Secondary | ICD-10-CM

## 2016-05-04 DIAGNOSIS — IMO0002 Reserved for concepts with insufficient information to code with codable children: Secondary | ICD-10-CM | POA: Insufficient documentation

## 2016-05-04 DIAGNOSIS — Z7689 Persons encountering health services in other specified circumstances: Secondary | ICD-10-CM

## 2016-05-04 DIAGNOSIS — E1165 Type 2 diabetes mellitus with hyperglycemia: Secondary | ICD-10-CM | POA: Insufficient documentation

## 2016-05-04 LAB — POCT GLYCOSYLATED HEMOGLOBIN (HGB A1C): Hemoglobin A1C: 7.2

## 2016-05-04 LAB — GLUCOSE, POCT (MANUAL RESULT ENTRY): POC Glucose: 120 mg/dl — AB (ref 70–99)

## 2016-05-04 MED ORDER — LISINOPRIL 40 MG PO TABS
40.0000 mg | ORAL_TABLET | Freq: Every day | ORAL | Status: DC
Start: 2016-05-04 — End: 2016-05-25

## 2016-05-04 MED ORDER — CLONIDINE HCL 0.1 MG PO TABS
0.1000 mg | ORAL_TABLET | Freq: Once | ORAL | Status: AC
  Administered 2016-05-04: 0.1 mg via ORAL

## 2016-05-04 MED ORDER — HYDROCHLOROTHIAZIDE 25 MG PO TABS: 25.0000 mg | ORAL_TABLET | Freq: Every day | ORAL | Status: DC

## 2016-05-04 NOTE — Patient Instructions (Addendum)
It was great seeing you today   Your blood pressure is dangerously high. I want you to stop taking the Vasotec and instead take Lisinopril 40 mg and HCTZ 25 mg daily.   When you get the medications today, please take the HCTZ.   I need you to follow up next week with Almyra Free, our other nurse practitioner for a blood pressure check.   You really need to work on diet and exercise. Reduce the amount of sodium in your diet.   Your A1c was 7.2   Follow up with me when you get back from [place] as well.   Your next physical is due in October.

## 2016-05-04 NOTE — Progress Notes (Signed)
Patient presents to clinic today to establish care. He is a pleasant 55 year old male who  has a past medical history of Diabetes mellitus without complication ([place]); Hyperlipidemia; Hypertension; and Drug abuse.   His last physical was in 08/2015 with NP Justin Mend.   He works two jobs and usually work 6 days a week.    Acute Concerns: Establish CAre  Chronic Issues:  DM - His last A1c was 7.6. He does not check often at home. Does not follow a diabetic diet. Currently taking Metformin 1000mg  BID   Hypertension  - He reports " it has been running a little high". He does not check his blood pressure at home.  He is having blurred vision on occasion. Today in the office his initial blood pressure was 200/110. He is currently taking Vasotec 20 mg daily.    Health Maintenance: Dental -- Does not see  Vision -- Does not see Immunizations -- UTD  Colonoscopy -- 2016  Diet: Does not eat healthy. He does not eat fast food.  Exercise: Does not exercise   Past Medical History  Diagnosis Date  . Diabetes mellitus without complication ([place])   . Hyperlipidemia   . Hypertension   . 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 Wrist surgery    . Left forearm tendon repair      Current Outpatient Prescriptions on File Prior to Visit  Medication Sig Dispense Refill  . ibuprofen (ADVIL,MOTRIN) 600 MG tablet Take 600 mg by mouth every 6 (six) hours as needed.    . metFORMIN (GLUCOPHAGE) 1000 MG tablet TAKE ONE TABLET BY MOUTH ONCE DAILY WITH BREAKFAST 90 tablet 2  . pravastatin (PRAVACHOL) 20 MG tablet Take 1 tablet (20 mg total) by mouth daily. 90 tablet 2  . aspirin 81 MG chewable tablet Chew 1 tablet (81 mg total) by mouth daily. (Patient not taking: Reported on 05/04/2016) 30 tablet 0  . meloxicam (MOBIC) 15 MG tablet TAKE ONE TABLET BY MOUTH ONCE DAILY (Patient not taking: Reported on 05/04/2016) 30 tablet 0   No current  facility-administered medications on file prior to visit.    No Known Allergies  Family History  Problem Relation Age of Onset  . Diabetes Mother   . Hypertension Mother   . Diabetes Maternal Grandmother   . Hypertension Maternal Grandmother   . Diabetes Maternal Grandfather   . Hypertension Maternal Grandfather   . Colon cancer Neg Hx   . Diabetes Father     Social History   Social History  . Marital Status: Married    Spouse Name: N/A  . Number of Children: N/A  . Years of Education: N/A   Occupational History  . Not on file.   Social History Main Topics  . Smoking status: Former Smoker    Types: Cigarettes    Quit date: 11/21/2012  . Smokeless tobacco: Never Used  . Alcohol Use: Not on file  . Drug Use: No  . Sexual Activity: Not on file   Other Topics Concern  . Not on file   Social History Narrative   High school education    Works at [employer]   Married   One biological child        Review of Systems  Constitutional: Negative.   Eyes: Positive for blurred vision.  Respiratory: Negative.   Cardiovascular: Negative.   Gastrointestinal: Negative.  Genitourinary: Negative.   Neurological: Negative.   Psychiatric/Behavioral: Negative.   All other systems reviewed and are negative.   BP 170/110 mmHg  Temp(Src) 98.1 F (36.7 C) (Oral)  Ht 5\' 10"  (1.778 m)  Wt 204 lb (92.534 kg)  BMI 29.27 kg/m2  Physical Exam  Constitutional: He is oriented to person, place, and time and well-developed, well-nourished, and in no distress. No distress.  HENT:  Head: Normocephalic and atraumatic.  Right Ear: External ear normal.  Left Ear: External ear normal.  Nose: Nose normal.  Mouth/Throat: Oropharynx is clear and moist. No oropharyngeal exudate.  Eyes: Conjunctivae and EOM are normal. Pupils are equal, round, and reactive to light. Right eye exhibits no discharge. Left eye exhibits no discharge. No scleral icterus.  Neck: Neck supple. No  thyromegaly present.  Cardiovascular: Normal rate, regular rhythm, normal heart sounds and intact distal pulses.  Exam reveals no gallop and no friction rub.   No murmur heard. Pulmonary/Chest: Effort normal and breath sounds normal. No respiratory distress. He has no wheezes. He has no rales. He exhibits no tenderness.  Lymphadenopathy:    He has no cervical adenopathy.  Neurological: He is alert and oriented to person, place, and time. Gait normal. GCS score is 15.  Skin: Skin is warm and dry. No rash noted. He is not diaphoretic. No erythema. No pallor.  Psychiatric: Mood, memory, affect and judgment normal.  Nursing note and vitals reviewed.    Assessment/Plan: 1. Encounter to establish care - Follow up in October for CPE - Follow up sooner if needed - He needs to eat healthy and exercise   2. Uncontrolled hypertension - Initial BP was 200/110. After Clonidine 0.1mg  dose his BP dropped to 168/90- after 15 minutes. He endorsed feeling better.  - Basic metabolic panel - cloNIDine (CATAPRES) tablet 0.1 mg; Take 1 tablet (0.1 mg total) by mouth once. - lisinopril (PRINIVIL,ZESTRIL) 40 MG tablet; Take 1 tablet (40 mg total) by mouth daily.  Dispense: 90 tablet; Refill: 3 - hydrochlorothiazide (HYDRODIURIL) 25 MG tablet; Take 1 tablet (25 mg total) by mouth daily.  Dispense: 30 tablet; Refill: 3 - Follow up in one week for recheck  - Monitor BP at home and bring log to appointments.  - Consider adding low dose beta blocker if not at or near goal   3. Uncontrolled type 2 diabetes mellitus with hyperosmolarity without coma, without long-term current use of insulin (HCC) - POC HgB A1c 7.2 - POC Glucose (CBG) - Basic metabolic panel - Continue with metformin 1000 mg BID - Consider adding glipizide.  - Needs to check BS at home Dorothyann Peng, NP

## 2016-05-11 ENCOUNTER — Encounter: Payer: Self-pay | Admitting: Family Medicine

## 2016-05-11 ENCOUNTER — Ambulatory Visit (INDEPENDENT_AMBULATORY_CARE_PROVIDER_SITE_OTHER): Payer: Managed Care, Other (non HMO) | Admitting: Family Medicine

## 2016-05-11 VITALS — BP 153/84 | HR 79 | Temp 98.3°F | Ht 70.0 in | Wt 200.0 lb

## 2016-05-11 DIAGNOSIS — I10 Essential (primary) hypertension: Secondary | ICD-10-CM

## 2016-05-11 NOTE — Progress Notes (Signed)
Pre visit review using our clinic review tool, if applicable. No additional management support is needed unless otherwise documented below in the visit note. 

## 2016-05-11 NOTE — Progress Notes (Signed)
   Subjective:    Patient ID: Ronald Pierce, male    DOB: June 03, 1961, 55 y.o.   MRN: [id number]  HPI Here to follow up severe HTN. He saw his PCP, Dorothyann Peng, one week ago and he was feeling lightheaded that day. His BP on arrival was 200/110, and he was given a single Clonidine. The BP came down a bit, and he was started on daily Lisinopril and HCTZ, and he was told to follow up in one week. Since then he has felt much better.    Review of Systems  Constitutional: Negative.   Respiratory: Negative.   Cardiovascular: Negative.   Neurological: Negative.        Objective:   Physical Exam  Constitutional: He is oriented to person, place, and time. He appears well-developed and well-nourished. No distress.  Neck: No thyromegaly present.  Cardiovascular: Normal rate, regular rhythm, normal heart sounds and intact distal pulses.   Pulmonary/Chest: Effort normal and breath sounds normal.  Musculoskeletal: He exhibits no edema.  Lymphadenopathy:    He has no cervical adenopathy.  Neurological: He is alert and oriented to person, place, and time.          Assessment & Plan:  His HTN is now under better control, though not quite to goal. He is set to follow up with his PCP in 2 weeks. He will stay on the current meds.  Laurey Morale, MD

## 2016-05-25 ENCOUNTER — Ambulatory Visit (INDEPENDENT_AMBULATORY_CARE_PROVIDER_SITE_OTHER): Payer: Managed Care, Other (non HMO) | Admitting: Adult Health

## 2016-05-25 ENCOUNTER — Encounter: Payer: Self-pay | Admitting: Adult Health

## 2016-05-25 VITALS — BP 154/78 | Temp 97.9°F | Ht 70.0 in | Wt 201.9 lb

## 2016-05-25 DIAGNOSIS — I10 Essential (primary) hypertension: Secondary | ICD-10-CM

## 2016-05-25 LAB — BASIC METABOLIC PANEL
BUN: 21 mg/dL (ref 6–23)
CALCIUM: 10.4 mg/dL (ref 8.4–10.5)
CO2: 29 mEq/L (ref 19–32)
Chloride: 99 mEq/L (ref 96–112)
Creatinine, Ser: 0.97 mg/dL (ref 0.40–1.50)
GFR: 102.53 mL/min (ref >60.00)
GLUCOSE: 126 mg/dL — AB (ref 70–99)
POTASSIUM: 5 meq/L (ref 3.5–5.1)
SODIUM: 136 meq/L (ref 135–145)

## 2016-05-25 MED ORDER — HYDROCHLOROTHIAZIDE 25 MG PO TABS: 25.0000 mg | ORAL_TABLET | Freq: Every day | ORAL | Status: DC

## 2016-05-25 MED ORDER — CARVEDILOL 12.5 MG PO TABS: 12.5000 mg | ORAL_TABLET | Freq: Two times a day (BID) | ORAL | Status: DC

## 2016-05-25 MED ORDER — LISINOPRIL 40 MG PO TABS
40.0000 mg | ORAL_TABLET | Freq: Every day | ORAL | Status: DC
Start: 2016-05-25 — End: 2017-04-26

## 2016-05-25 NOTE — Patient Instructions (Signed)
  It was great seeing you again.   Your blood pressure is much better controlled. We are not at goal yet. I have added a third medication called Coreg. Take this twice a day .   Follow up with me in one month or sooner if needed  Start monitoring your blood pressure and bring your log with you in a month

## 2016-05-25 NOTE — Progress Notes (Signed)
Subjective:    Patient ID: Ronald Pierce, male    DOB: 06-21-1961, 55 y.o.   MRN: [id number]  HPI  55 year old male who presents to the office today for 2 week follow up regarding hypertension. When I last saw him his blood pressure was 200/110. I gave him a clonopine in the office and his BP dropped slightly. He was started on lisinopril 40mg  and HCTZ 25 mg.   He saw Dr. Sarajane Jews last week at which time his BP was not at goal 153/84 but he reported feeling well.   Today in the office his BP continues to not be at goal. He got off work around 2 am this morning and slept for a few hours before coming to this appointment. He took his medications about an hour ago.   He is not checking his BP at home.   BP Readings from Last 3 Encounters:  05/25/16 154/78  05/11/16 153/84  05/04/16 170/110     Review of Systems  Constitutional: Negative.   Respiratory: Negative.   Cardiovascular: Negative.   Gastrointestinal: Negative.   Genitourinary: Negative.   Neurological: Negative.   Hematological: Negative.    Past Medical History  Diagnosis Date  . Diabetes mellitus without complication ([place])   . Hyperlipidemia   . Hypertension   . 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 Marital Status: Married    Spouse Name: N/A  . Number of Children: N/A  . Years of Education: N/A   Occupational History  . Not on file.   Social History Main Topics  . Smoking status: Former Smoker    Types: Cigarettes    Quit date: 11/21/2012  . Smokeless tobacco: Never Used  . Alcohol Use: 12.6 oz/week    0 Standard drinks or equivalent, 21 Shots of liquor per week     Comment: beer on weekends  . Drug Use: No  . Sexual Activity: Not on file   Other Topics Concern  . Not on file   Social History Narrative   High school education    Works at [employer]   Married   One biological child        Past Surgical History  Procedure  Laterality Date  . Wrist surgery    . Left forearm tendon repair      Family History  Problem Relation Age of Onset  . Diabetes Mother   . Hypertension Mother   . Diabetes Maternal Grandmother   . Hypertension Maternal Grandmother   . Diabetes Maternal Grandfather   . Hypertension Maternal Grandfather   . Colon cancer Neg Hx   . Diabetes Father     No Known Allergies  Current Outpatient Prescriptions on File Prior to Visit  Medication Sig Dispense Refill  . aspirin 81 MG chewable tablet Chew 1 tablet (81 mg total) by mouth daily. 30 tablet 0  . ibuprofen (ADVIL,MOTRIN) 600 MG tablet Take 600 mg by mouth every 6 (six) hours as needed. Reported on 05/11/2016    . meloxicam (MOBIC) 15 MG tablet TAKE ONE TABLET BY MOUTH ONCE DAILY 30 tablet 0  . metFORMIN (GLUCOPHAGE) 1000 MG tablet TAKE ONE TABLET BY MOUTH ONCE DAILY WITH BREAKFAST 90 tablet 2  . pravastatin (PRAVACHOL) 20 MG tablet Take 1 tablet (20 mg total) by mouth daily. 90 tablet 2   No current facility-administered medications on file prior to  visit.    BP 154/78 mmHg  Temp(Src) 97.9 F (36.6 C) (Oral)  Ht 5\' 10"  (1.778 m)  Wt 201 lb 14.4 oz (91.581 kg)  BMI 28.97 kg/m2       Objective:   Physical Exam  Constitutional: He is oriented to person, place, and time. He appears well-developed and well-nourished. No distress.  Cardiovascular: Normal rate, regular rhythm, normal heart sounds and intact distal pulses.  Exam reveals no gallop and no friction rub.   No murmur heard. Pulmonary/Chest: Effort normal and breath sounds normal. No respiratory distress. He has no wheezes. He has no rales. He exhibits no tenderness.  Neurological: He is alert and oriented to person, place, and time.  Skin: Skin is warm and dry. No rash noted. He is not diaphoretic. No erythema. No pallor.  Psychiatric: He has a normal mood and affect. His behavior is normal. Judgment and thought content normal.  Nursing note and vitals reviewed.      Assessment & Plan:  1. Uncontrolled hypertension - Much improved, not at goal yet though. - Will add Coreg 12.5 mg - hydrochlorothiazide (HYDRODIURIL) 25 MG tablet; Take 1 tablet (25 mg total) by mouth daily.  Dispense: 90 tablet; Refill: 1 - lisinopril (PRINIVIL,ZESTRIL) 40 MG tablet; Take 1 tablet (40 mg total) by mouth daily.  Dispense: 90 tablet; Refill: 3 - carvedilol (COREG) 12.5 MG tablet; Take 1 tablet (12.5 mg total) by mouth 2 (two) times daily with a meal.  Dispense: 60 tablet; Refill: 3 - Basic metabolic panel - Follow up in one month or sooner if needed - I need him to check his BP at home and bring log at home   Dorothyann Peng, NP

## 2016-06-22 ENCOUNTER — Encounter: Payer: Self-pay | Admitting: Adult Health

## 2016-06-22 ENCOUNTER — Ambulatory Visit (INDEPENDENT_AMBULATORY_CARE_PROVIDER_SITE_OTHER): Payer: Managed Care, Other (non HMO) | Admitting: Adult Health

## 2016-06-22 VITALS — BP 140/90 | Temp 98.6°F | Ht 70.0 in | Wt 201.9 lb

## 2016-06-22 DIAGNOSIS — E1100 Type 2 diabetes mellitus with hyperosmolarity without nonketotic hyperglycemic-hyperosmolar coma (NKHHC): Secondary | ICD-10-CM

## 2016-06-22 DIAGNOSIS — I10 Essential (primary) hypertension: Secondary | ICD-10-CM

## 2016-06-22 LAB — GLUCOSE, POCT (MANUAL RESULT ENTRY): POC GLUCOSE: 128 mg/dL — AB (ref 70–99)

## 2016-06-22 NOTE — Progress Notes (Signed)
Subjective:    Patient ID: Ronald Pierce, male    DOB: [birth year], 55 y.o.   MRN: [id number]  HPI  55 year old male who returns to the office today for follow up regarding hypertension. The last time I saw him Cored 12.5 mg was added to his regimen. Per his log, his blood pressure's have been between 123456 systolic. He had his blood pressure taken at the eye doctor last week and it was 138/82.   He reports " I am feeling really good"  He has not taken his blood pressure medication yet this morning.     Review of Systems  Constitutional: Negative.   Respiratory: Negative.   Cardiovascular: Negative.   Musculoskeletal: Negative.   Neurological: Negative.   All other systems reviewed and are negative.  Past Medical History:  Diagnosis Date  . Diabetes mellitus without complication ([place])   . 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 Hyperlipidemia   . Hypertension     Social History   Social History  . Marital status: Married    Spouse name: N/A  . Number of children: N/A  . Years of education: N/A   Occupational History  . Not on file.   Social History Main Topics  . Smoking status: Former Smoker    Types: Cigarettes    Quit date: 11/21/2012  . Smokeless tobacco: Never Used  . Alcohol use 12.6 oz/week    21 Shots of liquor per week     Comment: beer on weekends  . Drug use: No  . Sexual activity: Not on file   Other Topics Concern  . Not on file   Social History Narrative   High school education    Works at [employer]   Married   One biological child        Past Surgical History:  Procedure Laterality Date  . left forearm tendon repair    . WRIST SURGERY      Family History  Problem Relation Age of Onset  . Diabetes Mother   . Hypertension Mother   . Diabetes Maternal Grandmother   . Hypertension Maternal Grandmother   . Diabetes Maternal Grandfather   . Hypertension Maternal Grandfather   . Colon cancer Neg Hx   .  Diabetes Father     No Known Allergies  Current Outpatient Prescriptions on File Prior to Visit  Medication Sig Dispense Refill  . aspirin 81 MG chewable tablet Chew 1 tablet (81 mg total) by mouth daily. 30 tablet 0  . carvedilol (COREG) 12.5 MG tablet Take 1 tablet (12.5 mg total) by mouth 2 (two) times daily with a meal. 60 tablet 3  . hydrochlorothiazide (HYDRODIURIL) 25 MG tablet Take 1 tablet (25 mg total) by mouth daily. 90 tablet 1  . lisinopril (PRINIVIL,ZESTRIL) 40 MG tablet Take 1 tablet (40 mg total) by mouth daily. 90 tablet 3  . meloxicam (MOBIC) 15 MG tablet TAKE ONE TABLET BY MOUTH ONCE DAILY 30 tablet 0  . metFORMIN (GLUCOPHAGE) 1000 MG tablet TAKE ONE TABLET BY MOUTH ONCE DAILY WITH BREAKFAST 90 tablet 2  . pravastatin (PRAVACHOL) 20 MG tablet Take 1 tablet (20 mg total) by mouth daily. 90 tablet 2  . ibuprofen (ADVIL,MOTRIN) 600 MG tablet Take 600 mg by mouth every 6 (six) hours as needed. Reported on 05/11/2016     No current facility-administered medications on file prior to visit.     BP 140/90  Temp 98.6 F (37 C) (Oral)   Ht 5\' 10"  (1.778 m)   Wt 201 lb 14.4 oz (91.6 kg)   BMI 28.97 kg/m       Objective:   Physical Exam  Constitutional: He is oriented to person, place, and time. He appears well-developed and well-nourished. No distress.  Eyes: Conjunctivae and EOM are normal. Pupils are equal, round, and reactive to light. Right eye exhibits no discharge. Left eye exhibits no discharge. No scleral icterus.  Cardiovascular: Normal rate, regular rhythm, normal heart sounds and intact distal pulses.  Exam reveals no gallop and no friction rub.   No murmur heard. Pulmonary/Chest: Effort normal and breath sounds normal. No respiratory distress. He has no wheezes. He has no rales. He exhibits no tenderness.  Abdominal: Soft. Bowel sounds are normal.  Neurological: He is alert and oriented to person, place, and time.  Skin: Skin is warm and dry. No rash noted.  He is not diaphoretic. No erythema. No pallor.  Psychiatric: He has a normal mood and affect. His behavior is normal. Judgment and thought content normal.  Nursing note and vitals reviewed.      Assessment & Plan:  1. Essential hypertension - Much better controlled - No change in medication at this time - Continue to monitor at home  2. Uncontrolled type 2 diabetes mellitus with hyperosmolarity without coma, without long-term current use of insulin (HCC) - POC Glucose (CBG) - Follow up in 2 months for A1c check

## 2016-06-22 NOTE — Patient Instructions (Signed)
It was great seeing you again.   Your blood pressure looks much better per your log.   Get a arm cuff, a good company is [employer]. You can get them on [address] the cheapest  Follow up in 3 months for an A1c check

## 2016-07-26 ENCOUNTER — Other Ambulatory Visit: Payer: Self-pay | Admitting: Family

## 2016-08-05 ENCOUNTER — Other Ambulatory Visit: Payer: Self-pay | Admitting: Family

## 2016-09-21 ENCOUNTER — Ambulatory Visit: Payer: Managed Care, Other (non HMO) | Admitting: Adult Health

## 2016-09-26 ENCOUNTER — Ambulatory Visit (INDEPENDENT_AMBULATORY_CARE_PROVIDER_SITE_OTHER): Payer: Managed Care, Other (non HMO) | Admitting: Adult Health

## 2016-09-26 ENCOUNTER — Encounter: Payer: Self-pay | Admitting: Adult Health

## 2016-09-26 VITALS — BP 148/80 | Temp 98.6°F | Ht 70.0 in | Wt 198.5 lb

## 2016-09-26 DIAGNOSIS — I10 Essential (primary) hypertension: Secondary | ICD-10-CM

## 2016-09-26 DIAGNOSIS — IMO0001 Reserved for inherently not codable concepts without codable children: Secondary | ICD-10-CM

## 2016-09-26 DIAGNOSIS — E1165 Type 2 diabetes mellitus with hyperglycemia: Secondary | ICD-10-CM | POA: Diagnosis not present

## 2016-09-26 LAB — POCT GLYCOSYLATED HEMOGLOBIN (HGB A1C): Hemoglobin A1C: 6.7

## 2016-09-26 NOTE — Patient Instructions (Signed)
Start taking your medications daily.   Take in the morning  Coreg 12.5 mg and HCTZ 25 mg  Take at night  Coreg 12.5 mg and Lisinopril 40 mg   Monitor blood pressure at home. I recommend getting a blood cuff from a company called [employer].   Bring log to next visit in one month

## 2016-09-26 NOTE — Progress Notes (Signed)
Subjective:    Patient ID: Ronald Pierce, male    DOB: 04-17-1961, 55 y.o.   MRN: [id number]  HPI  55 year old male who presents to the office today for follow up regarding hypertension and diabetes. His last a1c was 7.2 in June 2017. He is currently taking Metformin 1000 mg BID. For hypertension he takes Coreg 12.5 mg, HCTZ 25mg , and lisinopril 40mg .   He reports in the office today that he has not been taking his medications sporadically and has not been taking them all at the same time. He is also not checking his blood pressure at home.   He has not been checking his blood sugars at home but has working on his diet.    Review of Systems  Constitutional: Negative.   HENT: Negative.   Eyes: Negative.   Respiratory: Negative.   Cardiovascular: Negative.   Neurological: Negative.   All other systems reviewed and are negative.  Past Medical History:  Diagnosis Date  . Diabetes mellitus without complication ([place])   . 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 Hyperlipidemia   . Hypertension     Social History   Social History  . Marital status: Married    Spouse name: N/A  . Number of children: N/A  . Years of education: N/A   Occupational History  . Not on file.   Social History Main Topics  . Smoking status: Former Smoker    Types: Cigarettes    Quit date: 11/21/2012  . Smokeless tobacco: Never Used  . Alcohol use 12.6 oz/week    21 Shots of liquor per week     Comment: beer on weekends  . Drug use: No  . Sexual activity: Not on file   Other Topics Concern  . Not on file   Social History Narrative   High school education    Works at [employer]   Married   One biological child        Past Surgical History:  Procedure Laterality Date  . left forearm tendon repair    . WRIST SURGERY      Family History  Problem Relation Age of Onset  . Diabetes Mother   . Hypertension Mother   . Diabetes Maternal Grandmother   . Hypertension  Maternal Grandmother   . Diabetes Maternal Grandfather   . Hypertension Maternal Grandfather   . Colon cancer Neg Hx   . Diabetes Father     No Known Allergies  Current Outpatient Prescriptions on File Prior to Visit  Medication Sig Dispense Refill  . aspirin 81 MG chewable tablet Chew 1 tablet (81 mg total) by mouth daily. 30 tablet 0  . carvedilol (COREG) 12.5 MG tablet Take 1 tablet (12.5 mg total) by mouth 2 (two) times daily with a meal. 60 tablet 3  . hydrochlorothiazide (HYDRODIURIL) 25 MG tablet Take 1 tablet (25 mg total) by mouth daily. 90 tablet 1  . ibuprofen (ADVIL,MOTRIN) 600 MG tablet Take 600 mg by mouth every 6 (six) hours as needed. Reported on 05/11/2016    . lisinopril (PRINIVIL,ZESTRIL) 40 MG tablet Take 1 tablet (40 mg total) by mouth daily. 90 tablet 3  . meloxicam (MOBIC) 15 MG tablet TAKE ONE TABLET BY MOUTH ONCE DAILY 30 tablet 0  . metFORMIN (GLUCOPHAGE) 1000 MG tablet TAKE ONE TABLET BY MOUTH ONCE DAILY WITH  BREAKFAST 90 tablet 2  . pravastatin (PRAVACHOL) 20 MG tablet TAKE ONE TABLET  BY MOUTH ONCE DAILY 90 tablet 2   No current facility-administered medications on file prior to visit.     BP (!) 148/80   Temp 98.6 F (37 C) (Oral)   Ht 5\' 10"  (1.778 m)   Wt 198 lb 8 oz (90 kg)   BMI 28.48 kg/m       Objective:   Physical Exam  Constitutional: He is oriented to person, place, and time. He appears well-developed and well-nourished. No distress.  Cardiovascular: Normal rate, regular rhythm, normal heart sounds and intact distal pulses.  Exam reveals no gallop and no friction rub.   No murmur heard. Pulmonary/Chest: Effort normal and breath sounds normal. No respiratory distress. He has no wheezes. He has no rales. He exhibits no tenderness.  Musculoskeletal: Normal range of motion. He exhibits no edema, tenderness or deformity.  Neurological: He is alert and oriented to person, place, and time.  Skin: Skin is warm and dry. No rash noted. He is not  diaphoretic. No erythema. No pallor.  Psychiatric: He has a normal mood and affect. His behavior is normal. Judgment and thought content normal.  Nursing note and vitals reviewed.     Assessment & Plan:  1. Essential hypertension - Educated on the importance of taking his medications as directed Take in the morning Coreg 12.5 mg and HCTZ 25 mg  Take at night  Coreg 12.5 mg and Lisinopril 40 mg   - Monitor BP at home and bring log to appointment in one month    2. Uncontrolled diabetes mellitus type 2 without complications, unspecified long term insulin use status (HCC)  - POC HgB A1c - 6.7.  - His a1c has dropped - Will continue to monitor - No change in medication at this time.   Dorothyann Peng, NP

## 2016-10-26 ENCOUNTER — Ambulatory Visit (INDEPENDENT_AMBULATORY_CARE_PROVIDER_SITE_OTHER): Payer: Managed Care, Other (non HMO) | Admitting: Adult Health

## 2016-10-26 ENCOUNTER — Encounter: Payer: Self-pay | Admitting: Adult Health

## 2016-10-26 VITALS — BP 124/76 | Temp 98.1°F | Ht 70.0 in | Wt 198.3 lb

## 2016-10-26 DIAGNOSIS — I10 Essential (primary) hypertension: Secondary | ICD-10-CM

## 2016-10-26 MED ORDER — MELOXICAM 15 MG PO TABS: 15.0000 mg | ORAL_TABLET | Freq: Every day | ORAL | 0 refills | Status: DC

## 2016-10-26 NOTE — Progress Notes (Signed)
Subjective:    Patient ID: Ronald Pierce, male    DOB: 1961/10/12, 55 y.o.   MRN: [id number]  HPI  55 year old male who presents to the office today for one month follow up regarding hypertension. During the last visit he disclosed that he was taking his medication sporadically. He was also not monitoring his blood pressures at home.   Today in the office he reports that his blood pressures have been better controlled, he is taking his medication as directed and is monitoring at home. His log shows blood pressure readings between 99/90- 167/84. Most of his blood pressure readings are in the 120's/80's  He denies any dizziness, lightheadedness or headaches.   Wt Readings from Last 3 Encounters:  10/26/16 198 lb 4.8 oz (89.9 kg)  09/26/16 198 lb 8 oz (90 kg)  06/22/16 201 lb 14.4 oz (91.6 kg)    Review of Systems  Constitutional: Negative.   Respiratory: Negative.   Cardiovascular: Negative.   Gastrointestinal: Negative.   Musculoskeletal: Negative.   Neurological: Negative.   Hematological: Negative.   All other systems reviewed and are negative.  Past Medical History:  Diagnosis Date  . Diabetes mellitus without complication ([place])   . 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 Hyperlipidemia   . Hypertension     Social History   Social History  . Marital status: Married    Spouse name: N/A  . Number of children: N/A  . Years of education: N/A   Occupational History  . Not on file.   Social History Main Topics  . Smoking status: Former Smoker    Types: Cigarettes    Quit date: 11/21/2012  . Smokeless tobacco: Never Used  . Alcohol use 12.6 oz/week    21 Shots of liquor per week     Comment: beer on weekends  . Drug use: No  . Sexual activity: Not on file   Other Topics Concern  . Not on file   Social History Narrative   High school education    Works at [employer]   Married   One biological child        Past Surgical History:    Procedure Laterality Date  . left forearm tendon repair    . WRIST SURGERY      Family History  Problem Relation Age of Onset  . Diabetes Mother   . Hypertension Mother   . Diabetes Maternal Grandmother   . Hypertension Maternal Grandmother   . Diabetes Maternal Grandfather   . Hypertension Maternal Grandfather   . Colon cancer Neg Hx   . Diabetes Father     No Known Allergies  Current Outpatient Prescriptions on File Prior to Visit  Medication Sig Dispense Refill  . aspirin 81 MG chewable tablet Chew 1 tablet (81 mg total) by mouth daily. 30 tablet 0  . carvedilol (COREG) 12.5 MG tablet Take 1 tablet (12.5 mg total) by mouth 2 (two) times daily with a meal. 60 tablet 3  . hydrochlorothiazide (HYDRODIURIL) 25 MG tablet Take 1 tablet (25 mg total) by mouth daily. 90 tablet 1  . ibuprofen (ADVIL,MOTRIN) 600 MG tablet Take 600 mg by mouth every 6 (six) hours as needed. Reported on 05/11/2016    . lisinopril (PRINIVIL,ZESTRIL) 40 MG tablet Take 1 tablet (40 mg total) by mouth daily. 90 tablet 3  . meloxicam (MOBIC) 15 MG tablet TAKE ONE TABLET BY MOUTH ONCE DAILY 30 tablet  0  . metFORMIN (GLUCOPHAGE) 1000 MG tablet TAKE ONE TABLET BY MOUTH ONCE DAILY WITH  BREAKFAST 90 tablet 2  . pravastatin (PRAVACHOL) 20 MG tablet TAKE ONE TABLET BY MOUTH ONCE DAILY 90 tablet 2   No current facility-administered medications on file prior to visit.     BP 124/76   Temp 98.1 F (36.7 C) (Oral)   Ht 5\' 10"  (1.778 m)   Wt 198 lb 4.8 oz (89.9 kg)   BMI 28.45 kg/m       Objective:   Physical Exam  Constitutional: He is oriented to person, place, and time. He appears well-developed and well-nourished. No distress.  Eyes: Conjunctivae and EOM are normal. Pupils are equal, round, and reactive to light.  Cardiovascular: Normal rate, regular rhythm, normal heart sounds and intact distal pulses.  Exam reveals no gallop and no friction rub.   No murmur heard. Pulmonary/Chest: Effort normal and  breath sounds normal. No respiratory distress. He has no wheezes. He has no rales. He exhibits no tenderness.  Neurological: He is alert and oriented to person, place, and time.  Skin: Skin is warm and dry. No rash noted. He is not diaphoretic. No erythema. No pallor.  Psychiatric: He has a normal mood and affect. His behavior is normal. Judgment and thought content normal.  Nursing note and vitals reviewed.      Assessment & Plan:  1. Essential hypertension - Better controlled.  - Continue with current plan.  - Follow up in 3 months for CPE or sooner if needed  Dorothyann Peng, NP

## 2017-01-24 ENCOUNTER — Ambulatory Visit (INDEPENDENT_AMBULATORY_CARE_PROVIDER_SITE_OTHER): Payer: BLUE CROSS/BLUE SHIELD | Admitting: Adult Health

## 2017-01-24 ENCOUNTER — Encounter: Payer: Self-pay | Admitting: Adult Health

## 2017-01-24 VITALS — BP 124/86 | Temp 97.8°F | Ht 70.0 in | Wt 203.0 lb

## 2017-01-24 DIAGNOSIS — I10 Essential (primary) hypertension: Secondary | ICD-10-CM | POA: Diagnosis not present

## 2017-01-24 DIAGNOSIS — E1100 Type 2 diabetes mellitus with hyperosmolarity without nonketotic hyperglycemic-hyperosmolar coma (NKHHC): Secondary | ICD-10-CM | POA: Diagnosis not present

## 2017-01-24 LAB — POCT GLYCOSYLATED HEMOGLOBIN (HGB A1C): Hemoglobin A1C: 7

## 2017-01-24 MED ORDER — METFORMIN HCL 1000 MG PO TABS: 1000.0000 mg | ORAL_TABLET | Freq: Two times a day (BID) | ORAL | 3 refills | Status: DC

## 2017-01-24 NOTE — Patient Instructions (Addendum)
Your A1c is 7.0. This is up from 6.7. I am going to increase your metformin from once a day to twice per day    Your weight is up 5 pounds since your last visit. Please work on reducing weight through a diabetic diet and exercise  Follow up in 3 months for your physical exam. No eating after midnight the night prior to the exam

## 2017-01-24 NOTE — Progress Notes (Signed)
Subjective:    Patient ID: Ronald Pierce, male    DOB: July 15, 1961, 56 y.o.   MRN: [id number]  HPI  56 year old male who  has a past medical history of Diabetes mellitus without complication ([place]); Drug abuse; Hyperlipidemia; and Hypertension. He presents to the office today for three month follow up regarding hypertension and diabetes.   He reports that he is taking his blood pressure medications and diabetes medication daily. He is currently taking Lisinopril 40 mg and Coreg 12.5 mg. He reports that HCTZ " makes me feel awful so I am not taking it." He reports not checking his blood pressures at home very often during the last three months nor is he checking his blood sugars.   His blood pressure during the last visit was 124/76. And his A1c was 6.7   He reports feeling well and has no acute complaints.   Wt Readings from Last 3 Encounters:  01/24/17 203 lb (92.1 kg)  10/26/16 198 lb 4.8 oz (89.9 kg)  09/26/16 198 lb 8 oz (90 kg)      Review of Systems  Constitutional: Negative.   HENT: Negative.   Eyes: Negative.   Respiratory: Negative.   Cardiovascular: Negative.   Genitourinary: Negative.   All other systems reviewed and are negative.  Past Medical History:  Diagnosis Date  . Diabetes mellitus without complication ([place])   . 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 Hyperlipidemia   . Hypertension     Social History   Social History  . Marital status: Married    Spouse name: N/A  . Number of children: N/A  . Years of education: N/A   Occupational History  . Not on file.   Social History Main Topics  . Smoking status: Former Smoker    Types: Cigarettes    Quit date: 11/21/2012  . Smokeless tobacco: Never Used  . Alcohol use 12.6 oz/week    21 Shots of liquor per week     Comment: beer on weekends  . Drug use: No  . Sexual activity: Not on file   Other Topics Concern  . Not on file   Social History Narrative   High school education      Works at [employer]   Married   One biological child        Past Surgical History:  Procedure Laterality Date  . left forearm tendon repair    . WRIST SURGERY      Family History  Problem Relation Age of Onset  . Diabetes Mother   . Hypertension Mother   . Diabetes Maternal Grandmother   . Hypertension Maternal Grandmother   . Diabetes Maternal Grandfather   . Hypertension Maternal Grandfather   . Colon cancer Neg Hx   . Diabetes Father     No Known Allergies  Current Outpatient Prescriptions on File Prior to Visit  Medication Sig Dispense Refill  . aspirin 81 MG chewable tablet Chew 1 tablet (81 mg total) by mouth daily. 30 tablet 0  . carvedilol (COREG) 12.5 MG tablet Take 1 tablet (12.5 mg total) by mouth 2 (two) times daily with a meal. 60 tablet 3  . hydrochlorothiazide (HYDRODIURIL) 25 MG tablet Take 1 tablet (25 mg total) by mouth daily. 90 tablet 1  . ibuprofen (ADVIL,MOTRIN) 600 MG tablet Take 600 mg by mouth every 6 (six) hours as needed. Reported on 05/11/2016    . lisinopril (PRINIVIL,ZESTRIL) 40  MG tablet Take 1 tablet (40 mg total) by mouth daily. 90 tablet 3  . meloxicam (MOBIC) 15 MG tablet Take 1 tablet (15 mg total) by mouth daily. 30 tablet 0  . metFORMIN (GLUCOPHAGE) 1000 MG tablet TAKE ONE TABLET BY MOUTH ONCE DAILY WITH  BREAKFAST 90 tablet 2  . pravastatin (PRAVACHOL) 20 MG tablet TAKE ONE TABLET BY MOUTH ONCE DAILY 90 tablet 2   No current facility-administered medications on file prior to visit.     BP 124/86   Temp 97.8 F (36.6 C) (Oral)   Ht 5\' 10"  (1.778 m)   Wt 203 lb (92.1 kg)   BMI 29.13 kg/m       Objective:   Physical Exam  Constitutional: He is oriented to person, place, and time. He appears well-developed and well-nourished. No distress.  Cardiovascular: Normal rate, regular rhythm, normal heart sounds and intact distal pulses.  Exam reveals no gallop and no friction rub.   No murmur heard. Pulmonary/Chest: Effort  normal and breath sounds normal. No respiratory distress. He has no wheezes. He has no rales. He exhibits no tenderness.  Musculoskeletal: Normal range of motion.  Neurological: He is alert and oriented to person, place, and time. No cranial nerve deficit. Coordination normal.  Skin: Skin is warm and dry. No rash noted. He is not diaphoretic. No erythema. No pallor.  Psychiatric: He has a normal mood and affect. Judgment and thought content normal.  Nursing note and vitals reviewed.     Assessment & Plan:  1. Essential hypertension - Controlled  -d/c HCTZ  - needs to work on losing weight  2. Uncontrolled type 2 diabetes mellitus with hyperosmolarity without coma, without long-term current use of insulin (HCC)  - POC HgB A1c- 7.0  - Will increase metformin from daily to BID  - Follow up in 3 months at his physical   Dorothyann Peng, NP

## 2017-02-17 ENCOUNTER — Other Ambulatory Visit: Payer: Self-pay | Admitting: Adult Health

## 2017-02-17 DIAGNOSIS — I10 Essential (primary) hypertension: Secondary | ICD-10-CM

## 2017-02-18 NOTE — Telephone Encounter (Signed)
Please call and see if he has been taking it. During his last visit he was not taking it and his blood pressure was normal

## 2017-02-18 NOTE — Telephone Encounter (Signed)
This medication has been discontinued for awhile. Please advise on refill.

## 2017-02-19 NOTE — Telephone Encounter (Signed)
I left message for patient to return phone call.   

## 2017-02-20 NOTE — Telephone Encounter (Signed)
I left 2nd message for patient to return phone call.   

## 2017-02-21 NOTE — Telephone Encounter (Signed)
Patient returned phone call. He states he does not need this medication. I will refuse medication.

## 2017-04-13 ENCOUNTER — Other Ambulatory Visit: Payer: Self-pay | Admitting: Adult Health

## 2017-04-13 DIAGNOSIS — I10 Essential (primary) hypertension: Secondary | ICD-10-CM

## 2017-04-13 NOTE — Telephone Encounter (Signed)
Ok to refill for one year  

## 2017-04-14 ENCOUNTER — Other Ambulatory Visit: Payer: Self-pay | Admitting: Adult Health

## 2017-04-14 DIAGNOSIS — I10 Essential (primary) hypertension: Secondary | ICD-10-CM

## 2017-04-15 NOTE — Telephone Encounter (Signed)
Is this ok to refill?  

## 2017-04-15 NOTE — Telephone Encounter (Signed)
I do not have this on his current medication list. He is currently taking Lisinopril which is the same class of medication as this

## 2017-04-26 ENCOUNTER — Ambulatory Visit (INDEPENDENT_AMBULATORY_CARE_PROVIDER_SITE_OTHER): Payer: BLUE CROSS/BLUE SHIELD | Admitting: Adult Health

## 2017-04-26 ENCOUNTER — Encounter: Payer: Self-pay | Admitting: Adult Health

## 2017-04-26 VITALS — BP 132/70 | Temp 97.9°F | Ht 70.0 in | Wt 195.6 lb

## 2017-04-26 DIAGNOSIS — Z0000 Encounter for general adult medical examination without abnormal findings: Secondary | ICD-10-CM

## 2017-04-26 DIAGNOSIS — E7800 Pure hypercholesterolemia, unspecified: Secondary | ICD-10-CM | POA: Diagnosis not present

## 2017-04-26 DIAGNOSIS — Z1159 Encounter for screening for other viral diseases: Secondary | ICD-10-CM | POA: Diagnosis not present

## 2017-04-26 DIAGNOSIS — I10 Essential (primary) hypertension: Secondary | ICD-10-CM

## 2017-04-26 DIAGNOSIS — E1100 Type 2 diabetes mellitus with hyperosmolarity without nonketotic hyperglycemic-hyperosmolar coma (NKHHC): Secondary | ICD-10-CM | POA: Diagnosis not present

## 2017-04-26 LAB — CBC WITH DIFFERENTIAL/PLATELET
Basophils Absolute: 0.1 10*3/uL (ref 0.0–0.1)
Basophils Relative: 1.2 % (ref 0.0–3.0)
EOS PCT: 2.2 % (ref 0.0–5.0)
Eosinophils Absolute: 0.2 10*3/uL (ref 0.0–0.7)
HCT: 41.2 % (ref 39.0–52.0)
Hemoglobin: 13.6 g/dL (ref 13.0–17.0)
LYMPHS ABS: 1.9 10*3/uL (ref 0.7–4.0)
Lymphocytes Relative: 27 % (ref 12.0–46.0)
MCHC: 33 g/dL (ref 30.0–36.0)
MCV: 82.7 fl (ref 78.0–100.0)
MONO ABS: 0.8 10*3/uL (ref 0.1–1.0)
MONOS PCT: 11.7 % (ref 3.0–12.0)
NEUTROS ABS: 4 10*3/uL (ref 1.4–7.7)
NEUTROS PCT: 57.9 % (ref 43.0–77.0)
PLATELETS: 453 10*3/uL — AB (ref 150.0–400.0)
RBC: 4.98 Mil/uL (ref 4.22–5.81)
RDW: 14 % (ref 11.5–15.5)
WBC: 7 10*3/uL (ref 4.0–10.5)

## 2017-04-26 LAB — HEPATIC FUNCTION PANEL
ALK PHOS: 43 U/L (ref 39–117)
ALT: 29 U/L (ref 0–53)
AST: 25 U/L (ref 0–37)
Albumin: 4.5 g/dL (ref 3.5–5.2)
BILIRUBIN DIRECT: 0.1 mg/dL (ref 0.0–0.3)
TOTAL PROTEIN: 8.4 g/dL — AB (ref 6.0–8.3)
Total Bilirubin: 0.4 mg/dL (ref 0.2–1.2)

## 2017-04-26 LAB — BASIC METABOLIC PANEL
BUN: 19 mg/dL (ref 6–23)
CO2: 31 meq/L (ref 19–32)
Calcium: 10.3 mg/dL (ref 8.4–10.5)
Chloride: 100 mEq/L (ref 96–112)
Creatinine, Ser: 1.06 mg/dL (ref 0.40–1.50)
GFR: 92.25 mL/min (ref >60.00)
GLUCOSE: 139 mg/dL — AB (ref 70–99)
POTASSIUM: 4.7 meq/L (ref 3.5–5.1)
SODIUM: 137 meq/L (ref 135–145)

## 2017-04-26 LAB — PSA: PSA: 0.67 ng/mL (ref 0.10–4.00)

## 2017-04-26 LAB — LIPID PANEL
CHOL/HDL RATIO: 4
Cholesterol: 184 mg/dL (ref 0–200)
HDL: 44.8 mg/dL (ref >39.00)
LDL Cholesterol: 107 mg/dL — ABNORMAL HIGH (ref 0–99)
NONHDL: 139.45
Triglycerides: 164 mg/dL — ABNORMAL HIGH (ref 0.0–149.0)
VLDL: 32.8 mg/dL (ref 0.0–40.0)

## 2017-04-26 LAB — TSH: TSH: 1.75 u[IU]/mL (ref 0.35–4.50)

## 2017-04-26 LAB — HEPATITIS C ANTIBODY: HCV AB: NEGATIVE

## 2017-04-26 LAB — HEMOGLOBIN A1C: HEMOGLOBIN A1C: 8.4 % — AB (ref 4.6–6.5)

## 2017-04-26 MED ORDER — PRAVASTATIN SODIUM 20 MG PO TABS: 20.0000 mg | ORAL_TABLET | Freq: Every day | ORAL | 3 refills | Status: DC

## 2017-04-26 MED ORDER — LISINOPRIL 40 MG PO TABS: 40.0000 mg | ORAL_TABLET | Freq: Every day | ORAL | 3 refills | Status: DC

## 2017-04-26 MED ORDER — HYDROCHLOROTHIAZIDE 25 MG PO TABS: 25.0000 mg | ORAL_TABLET | Freq: Every day | ORAL | 3 refills | Status: DC

## 2017-04-26 NOTE — Patient Instructions (Signed)
It was great seeing you today!   I will follow up with you regarding your labs   I have sent in new prescriptions for HCTZ and pravastatin   Please take Coreg twice a day   Follow up with me in three months to check your diabetes

## 2017-04-26 NOTE — Progress Notes (Signed)
Subjective:    Patient ID: Ronald Pierce, male    DOB: [birth year], 56 y.o.   MRN: [id number]  HPI  Patient presents for yearly preventative medicine examination. He is a pleasant 56 year old male who  has a past medical history of Diabetes mellitus without complication ([employer]); Drug abuse; Hyperlipidemia; and Hypertension.  All immunizations and health maintenance protocols were reviewed with the patient and needed orders were placed. Vaccinations are up to date   Appropriate screening laboratory values were ordered for the patient including screening of hyperlipidemia, renal function and hepatic function. If indicated by BPH, a PSA was ordered.  Medication reconciliation,  past medical history, social history, problem list and allergies were reviewed in detail with the patient  Goals were established with regard to weight loss, exercise, and  diet in compliance with medications. He does not exercise nor does he follow a  Diabetic diet   Wt Readings from Last 3 Encounters:  04/26/17 195 lb 9.6 oz (88.7 kg)  01/24/17 203 lb (92.1 kg)  10/26/16 198 lb 4.8 oz (89.9 kg)     End of life planning was discussed.  He is up to date on his colonoscopy. He does not do routine dental exams. He does see an eye doctor on a yearly basis   His blood pressure is controlled with Coreg 12.5 mg, HCTZ 25 mg and lisinopril 40 g   He takes pravastatin 20 mg and ASA 81 mg for hyperlipidemia   Diabetes is controlled with Metformin 1000mg  BID  Lab Results  Component Value Date   HGBA1C 7.0 01/24/2017    Review of Systems  Constitutional: Negative.   HENT: Negative.   Eyes: Negative.   Respiratory: Negative.   Cardiovascular: Negative.   Gastrointestinal: Negative.   Endocrine: Negative.   Genitourinary: Negative.   Musculoskeletal: Negative.   Skin: Negative.   Allergic/Immunologic: Negative.   Neurological: Negative.   Hematological: Negative.   Psychiatric/Behavioral: Negative.   All  other systems reviewed and are negative.  Past Medical History:  Diagnosis Date  . Diabetes mellitus without complication ([place])   . 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 Hyperlipidemia   . Hypertension     Social History   Social History  . Marital status: Married    Spouse name: N/A  . Number of children: N/A  . Years of education: N/A   Occupational History  . Not on file.   Social History Main Topics  . Smoking status: Former Smoker    Types: Cigarettes    Quit date: 11/21/2012  . Smokeless tobacco: Never Used  . Alcohol use 12.6 oz/week    21 Shots of liquor per week     Comment: beer on weekends  . Drug use: No  . Sexual activity: Not on file   Other Topics Concern  . Not on file   Social History Narrative   High school education    Works at [employer]   Married   One biological child        Past Surgical History:  Procedure Laterality Date  . left forearm tendon repair    . WRIST SURGERY      Family History  Problem Relation Age of Onset  . Diabetes Mother   . Hypertension Mother   . Diabetes Maternal Grandmother   . Hypertension Maternal Grandmother   . Diabetes Maternal Grandfather   . Hypertension Maternal Grandfather   . Colon  cancer Neg Hx   . Diabetes Father     No Known Allergies  Current Outpatient Prescriptions on File Prior to Visit  Medication Sig Dispense Refill  . aspirin 81 MG chewable tablet Chew 1 tablet (81 mg total) by mouth daily. 30 tablet 0  . carvedilol (COREG) 12.5 MG tablet TAKE ONE TABLET BY MOUTH TWICE DAILY WITH A MEAL 180 tablet 3  . ibuprofen (ADVIL,MOTRIN) 600 MG tablet Take 600 mg by mouth every 6 (six) hours as needed. Reported on 05/11/2016    . lisinopril (PRINIVIL,ZESTRIL) 40 MG tablet Take 1 tablet (40 mg total) by mouth daily. 90 tablet 3  . metFORMIN (GLUCOPHAGE) 1000 MG tablet Take 1 tablet (1,000 mg total) by mouth 2 (two) times daily with a meal. 180 tablet 3  . pravastatin  (PRAVACHOL) 20 MG tablet TAKE ONE TABLET BY MOUTH ONCE DAILY 90 tablet 2   No current facility-administered medications on file prior to visit.     BP 132/70 (BP Location: Left Arm, Patient Position: Sitting, Cuff Size: Normal)   Temp 97.9 F (36.6 C) (Oral)   Ht 5\' 10"  (1.778 m)   Wt 195 lb 9.6 oz (88.7 kg)   BMI 28.07 kg/m       Objective:   Physical Exam  Constitutional: He is oriented to person, place, and time. He appears well-developed and well-nourished. No distress.  Obese   HENT:  Head: Normocephalic and atraumatic.  Right Ear: External ear normal.  Left Ear: External ear normal.  Nose: Nose normal.  Mouth/Throat: Oropharynx is clear and moist. No oropharyngeal exudate.  Eyes: Conjunctivae and EOM are normal. Pupils are equal, round, and reactive to light. Right eye exhibits no discharge. Left eye exhibits no discharge. No scleral icterus.  Neck: Normal range of motion. Neck supple. No JVD present. No tracheal deviation present. No thyromegaly present.  Cardiovascular: Normal rate, regular rhythm, normal heart sounds and intact distal pulses.  Exam reveals no gallop and no friction rub.   No murmur heard. Pulmonary/Chest: Effort normal and breath sounds normal. No stridor. No respiratory distress. He has no wheezes. He has no rales. He exhibits no tenderness.  Abdominal: Soft. Bowel sounds are normal. He exhibits no distension and no mass. There is no tenderness. There is no rebound and no guarding.  Genitourinary: Rectum normal. Prostate is enlarged.  Musculoskeletal: Normal range of motion. He exhibits no edema, tenderness or deformity.  Lymphadenopathy:    He has no cervical adenopathy.  Neurological: He is alert and oriented to person, place, and time. He has normal reflexes. He displays normal reflexes. No cranial nerve deficit. He exhibits normal muscle tone. Coordination normal.  Skin: Skin is warm and dry. No rash noted. He is not diaphoretic. No erythema. No  pallor.  Psychiatric: He has a normal mood and affect. Judgment and thought content normal.  Nursing note and vitals reviewed.     Assessment & Plan:  1. Routine general medical examination at a health care facility - Educated on the importance of diet and exercise  - Needs to work on losing weight  - Basic metabolic panel - CBC with Differential/Platelet - Hemoglobin A1c - Hepatic function panel - Lipid panel - TSH - PSA  2. Essential hypertension - Well controlled. No change in medication  - hydrochlorothiazide (HYDRODIURIL) 25 MG tablet; Take 1 tablet (25 mg total) by mouth daily.  Dispense: 90 tablet; Refill: 3 - Basic metabolic panel - CBC with Differential/Platelet - Hemoglobin A1c - Hepatic function  panel - Lipid panel - TSH - PSA - lisinopril (PRINIVIL,ZESTRIL) 40 MG tablet; Take 1 tablet (40 mg total) by mouth daily.  Dispense: 90 tablet; Refill: 3  3. Uncontrolled type 2 diabetes mellitus with hyperosmolarity without coma, without long-term current use of insulin (HCC) - Basic metabolic panel - CBC with Differential/Platelet - Hemoglobin A1c - Hepatic function panel - Lipid panel - TSH - PSA - Consider adding glipizide  - Follow up in three months   4. HYPERCHOLESTEROLEMIA  - pravastatin (PRAVACHOL) 20 MG tablet; Take 1 tablet (20 mg total) by mouth daily.  Dispense: 90 tablet; Refill: 3 - Basic metabolic panel - CBC with Differential/Platelet - Hemoglobin A1c - Hepatic function panel - Lipid panel - TSH - PSA - Consider increasing statin   5. Need for hepatitis C screening test  - Hep C Antibody  Dorothyann Peng, NP

## 2017-05-02 ENCOUNTER — Other Ambulatory Visit: Payer: Self-pay

## 2017-05-02 MED ORDER — GLIPIZIDE 5 MG PO TABS: 5.0000 mg | ORAL_TABLET | Freq: Every day | ORAL | 5 refills | Status: DC

## 2017-05-04 ENCOUNTER — Other Ambulatory Visit: Payer: Self-pay | Admitting: Adult Health

## 2017-05-16 ENCOUNTER — Other Ambulatory Visit: Payer: Self-pay | Admitting: Adult Health

## 2017-05-16 NOTE — Telephone Encounter (Signed)
It looks like this was discontinued at his last visit. Is this ok to refill?

## 2017-05-17 NOTE — Telephone Encounter (Signed)
I contacted patient. He states that he does need this medication. He states that his shoulder gives him trouble every now and then, and he likes to take this medication when it is hurting, and it really helps. Rx has been sent in. Thanks!

## 2017-05-17 NOTE — Telephone Encounter (Signed)
Can you see if he needs it? If he does we can send in 30 days + 3

## 2017-08-01 ENCOUNTER — Ambulatory Visit (INDEPENDENT_AMBULATORY_CARE_PROVIDER_SITE_OTHER): Payer: BLUE CROSS/BLUE SHIELD | Admitting: Adult Health

## 2017-08-01 ENCOUNTER — Encounter: Payer: Self-pay | Admitting: Adult Health

## 2017-08-01 VITALS — BP 100/58 | Temp 97.9°F | Wt 198.0 lb

## 2017-08-01 DIAGNOSIS — I10 Essential (primary) hypertension: Secondary | ICD-10-CM

## 2017-08-01 DIAGNOSIS — E1100 Type 2 diabetes mellitus with hyperosmolarity without nonketotic hyperglycemic-hyperosmolar coma (NKHHC): Secondary | ICD-10-CM

## 2017-08-01 LAB — POCT GLYCOSYLATED HEMOGLOBIN (HGB A1C): Hemoglobin A1C: 6.4

## 2017-08-01 MED ORDER — LISINOPRIL 40 MG PO TABS: 40.0000 mg | ORAL_TABLET | Freq: Every day | ORAL | 1 refills | Status: DC

## 2017-08-01 NOTE — Progress Notes (Signed)
Subjective:    Patient ID: Ronald Pierce, male    DOB: November 20, 1961, 56 y.o.   MRN: [id number]  HPI  56 year old male who  has a past medical history of Diabetes mellitus without complication ([place]); Drug abuse; Hyperlipidemia; and Hypertension. He presents to the office today for three month follow up regarding diabetes. He is currently maintained on glipizide 5 mg and metformin 1000 mg. His last A1c was 8.4   Today in the office he reports that he has started taking medication daily - as prescribed. He states " I am feeling a lot better."   Today in the office his blood pressure is 100/54. He is having episodes of momentary lightheadedness that is lasting about 5 min.    Review of Systems See HPI   Past Medical History:  Diagnosis Date  . Diabetes mellitus without complication ([place])   . 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 Hyperlipidemia   . Hypertension     Social History   Social History  . Marital status: Married    Spouse name: N/A  . Number of children: N/A  . Years of education: N/A   Occupational History  . Not on file.   Social History Main Topics  . Smoking status: Former Smoker    Types: Cigarettes    Quit date: 11/21/2012  . Smokeless tobacco: Never Used  . Alcohol use 12.6 oz/week    21 Shots of liquor per week     Comment: beer on weekends  . Drug use: No  . Sexual activity: Not on file   Other Topics Concern  . Not on file   Social History Narrative   High school education    Works at [employer]   Married   One biological child        Past Surgical History:  Procedure Laterality Date  . left forearm tendon repair    . WRIST SURGERY      Family History  Problem Relation Age of Onset  . Diabetes Mother   . Hypertension Mother   . Diabetes Father   . Diabetes Maternal Grandmother   . Hypertension Maternal Grandmother   . Diabetes Maternal Grandfather   . Hypertension Maternal Grandfather   . Colon cancer Neg  Hx     No Known Allergies  Current Outpatient Prescriptions on File Prior to Visit  Medication Sig Dispense Refill  . aspirin 81 MG chewable tablet Chew 1 tablet (81 mg total) by mouth daily. 30 tablet 0  . carvedilol (COREG) 12.5 MG tablet TAKE ONE TABLET BY MOUTH TWICE DAILY WITH A MEAL 180 tablet 3  . glipiZIDE (GLUCOTROL) 5 MG tablet Take 1 tablet (5 mg total) by mouth daily before breakfast. 30 tablet 5  . hydrochlorothiazide (HYDRODIURIL) 25 MG tablet Take 1 tablet (25 mg total) by mouth daily. 90 tablet 3  . ibuprofen (ADVIL,MOTRIN) 600 MG tablet Take 600 mg by mouth every 6 (six) hours as needed. Reported on 05/11/2016    . lisinopril (PRINIVIL,ZESTRIL) 40 MG tablet Take 1 tablet (40 mg total) by mouth daily. 90 tablet 3  . meloxicam (MOBIC) 15 MG tablet TAKE ONE TABLET BY MOUTH ONCE DAILY 30 tablet 3  . metFORMIN (GLUCOPHAGE) 1000 MG tablet TAKE ONE TABLET BY MOUTH ONCE DAILY WITH  BREAKFAST 90 tablet 1  . pravastatin (PRAVACHOL) 20 MG tablet Take 1 tablet (20 mg total) by mouth daily. 90 tablet 3  No current facility-administered medications on file prior to visit.     BP (!) 100/58 (BP Location: Left Arm)   Temp 97.9 F (36.6 C) (Oral)   Wt 198 lb (89.8 kg)   BMI 28.41 kg/m       Objective:   Physical Exam  Constitutional: He is oriented to person, place, and time. He appears well-developed and well-nourished. No distress.  Cardiovascular: Normal rate, regular rhythm, normal heart sounds and intact distal pulses.  Exam reveals no gallop.   No murmur heard. Pulmonary/Chest: Effort normal and breath sounds normal. No respiratory distress. He has no wheezes. He has no rales. He exhibits no tenderness.  Neurological: He is alert and oriented to person, place, and time.  Skin: Skin is warm and dry. No rash noted. He is not diaphoretic. No erythema. No pallor.  Psychiatric: He has a normal mood and affect. His behavior is normal. Judgment and thought content normal.  Vitals  reviewed.     Assessment & Plan:  1. Uncontrolled type 2 diabetes mellitus with hyperosmolarity without coma, without long-term current use of insulin (HCC) - POCT A1C- has improved to 6.4  - Continue with current regimen.  - Follow up in 3 months   2. Essential hypertension - I am going to decrease lisinopril from 40 mg to 30 mg - lisinopril (PRINIVIL,ZESTRIL) 40 MG tablet; Take 1 tablet (40 mg total) by mouth daily.  Dispense: 90 tablet; Refill: 1 - Monitor BP at home  - Return precautions given  - Follow up with in 3 months   Dorothyann Peng, NP

## 2017-08-02 ENCOUNTER — Telehealth: Payer: Self-pay | Admitting: Adult Health

## 2017-08-02 MED ORDER — LISINOPRIL 20 MG PO TABS: 20.0000 mg | ORAL_TABLET | Freq: Every day | ORAL | 0 refills | Status: DC

## 2017-08-02 NOTE — Telephone Encounter (Signed)
Left the pt a message to pick up new rx at the pharmacy.  Instructed him NOT to take 40 mg every other day.  Call back if needed.

## 2017-08-02 NOTE — Telephone Encounter (Signed)
I do not want him to take lisinopril every other day. As we discussed I would be happy to call in a lower dose. The cost of lisinopril is 4 $

## 2017-08-02 NOTE — Telephone Encounter (Signed)
Pt is on two bp med and would like to know if he can cut lisinopril 40 mg cut in half due to cost or can he take med every other day are just stop one of bp  med

## 2017-08-02 NOTE — Telephone Encounter (Signed)
Left a message for a return call.

## 2017-08-02 NOTE — Telephone Encounter (Signed)
Spoke to the pt.  He reports that he felt light headed yesterday.  He took his blood pressure and it was 100/58.  He was seen in [place] on 08/01/17 and was told to cut lisinopril in half.  He says the tablet is very small and would like to know if it would be okay to take 1 whole tablet every other day.  Pt does not have a blood pressure cuff at home but does have a way to monitor.  Please advise.  Thanks!!  Ok to leave a detailed message on voicemail.  Pt on his way to work.

## 2017-09-01 ENCOUNTER — Other Ambulatory Visit: Payer: Self-pay | Admitting: Adult Health

## 2017-09-02 NOTE — Telephone Encounter (Signed)
Sent to the pharmacy by e-scribe for 90 days.  Pt has upcoming appt on 11/01/17.

## 2017-09-04 DIAGNOSIS — E119 Type 2 diabetes mellitus without complications: Secondary | ICD-10-CM | POA: Diagnosis not present

## 2017-10-01 ENCOUNTER — Telehealth: Payer: Self-pay | Admitting: Adult Health

## 2017-10-01 MED ORDER — IBUPROFEN 600 MG PO TABS: 600.0000 mg | ORAL_TABLET | Freq: Four times a day (QID) | ORAL | 0 refills | Status: DC | PRN

## 2017-10-01 NOTE — Telephone Encounter (Signed)
Sent to the pharmacy by e-scribe. 

## 2017-10-01 NOTE — Telephone Encounter (Signed)
Pt request refill  ibuprofen (ADVIL,MOTRIN) 800 MG tablet   [place], [place] - [address]  Pt having shoulder pain.  Pt states he dropped off at the office on Friday, but I do not see nay message.

## 2017-10-01 NOTE — Telephone Encounter (Signed)
Ok to refill for 30 days  

## 2017-10-04 ENCOUNTER — Ambulatory Visit (INDEPENDENT_AMBULATORY_CARE_PROVIDER_SITE_OTHER): Payer: BLUE CROSS/BLUE SHIELD | Admitting: Adult Health

## 2017-10-04 ENCOUNTER — Encounter: Payer: Self-pay | Admitting: Adult Health

## 2017-10-04 VITALS — BP 124/72 | Temp 97.7°F | Wt 205.0 lb

## 2017-10-04 DIAGNOSIS — S161XXA Strain of muscle, fascia and tendon at neck level, initial encounter: Secondary | ICD-10-CM | POA: Diagnosis not present

## 2017-10-04 MED ORDER — CYCLOBENZAPRINE HCL 10 MG PO TABS: 10.0000 mg | ORAL_TABLET | Freq: Two times a day (BID) | ORAL | 0 refills | Status: DC | PRN

## 2017-10-04 NOTE — Progress Notes (Signed)
Subjective:    Patient ID: Ronald Pierce, male    DOB: 17-Jun-1961, 56 y.o.   MRN: [id number]  HPI  57 year old male who  has a past medical history of Diabetes mellitus without complication ([place]), Drug abuse ([place]), Hyperlipidemia, and Hypertension. He presents to the office today with the acute complaint of neck pain x 1 week. He reports that he has a " tightening" and "pulling" when he turns his head. ROM is worse when he turns his head right.   He has been using Tylenol/Motrin and hot showers where work for a short time   He denies any trauma, dizziness or blurred vision.   Review of Systems See HPI   Past Medical History:  Diagnosis Date  . Diabetes mellitus without complication ([place])   . 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 Hyperlipidemia   . Hypertension     Social History   Socioeconomic History  . Marital status: Married    Spouse name: Not on file  . Number of children: Not on file  . Years of education: Not on file  . Highest education level: Not on file  Social Needs  . Financial resource strain: Not on file  . Food insecurity - worry: Not on file  . Food insecurity - inability: Not on file  . Transportation needs - medical: Not on file  . Transportation needs - non-medical: Not on file  Occupational History  . Not on file  Tobacco Use  . Smoking status: Former Smoker    Types: Cigarettes    Last attempt to quit: 11/21/2012    Years since quitting: 4.8  . Smokeless tobacco: Never Used  Substance and Sexual Activity  . Alcohol use: Yes    Alcohol/week: 12.6 oz    Types: 21 Shots of liquor per week    Comment: beer on weekends  . Drug use: No  . Sexual activity: Not on file  Other Topics Concern  . Not on file  Social History Narrative   High school education    Works at [employer]   Married   One biological child     Past Surgical History:  Procedure Laterality Date  . left forearm tendon repair    . WRIST  SURGERY      Family History  Problem Relation Age of Onset  . Diabetes Mother   . Hypertension Mother   . Diabetes Father   . Diabetes Maternal Grandmother   . Hypertension Maternal Grandmother   . Diabetes Maternal Grandfather   . Hypertension Maternal Grandfather   . Colon cancer Neg Hx     No Known Allergies  Current Outpatient Medications on File Prior to Visit  Medication Sig Dispense Refill  . aspirin 81 MG chewable tablet Chew 1 tablet (81 mg total) by mouth daily. 30 tablet 0  . carvedilol (COREG) 12.5 MG tablet TAKE ONE TABLET BY MOUTH TWICE DAILY WITH A MEAL 180 tablet 3  . glipiZIDE (GLUCOTROL) 5 MG tablet Take 1 tablet (5 mg total) by mouth daily before breakfast. 30 tablet 5  . hydrochlorothiazide (HYDRODIURIL) 25 MG tablet Take 1 tablet (25 mg total) by mouth daily. 90 tablet 3  . ibuprofen (ADVIL,MOTRIN) 600 MG tablet Take 1 tablet (600 mg total) every 6 (six) hours as needed by mouth. Reported on 05/11/2016 30 tablet 0  . lisinopril (PRINIVIL,ZESTRIL) 20 MG tablet TAKE 1 TABLET BY MOUTH  DAILY 90 tablet  0  . meloxicam (MOBIC) 15 MG tablet TAKE ONE TABLET BY MOUTH ONCE DAILY 30 tablet 3  . metFORMIN (GLUCOPHAGE) 1000 MG tablet TAKE ONE TABLET BY MOUTH ONCE DAILY WITH  BREAKFAST 90 tablet 1  . pravastatin (PRAVACHOL) 20 MG tablet Take 1 tablet (20 mg total) by mouth daily. 90 tablet 3   No current facility-administered medications on file prior to visit.     BP 124/72 (BP Location: Left Arm)   Temp 97.7 F (36.5 C) (Oral)   Wt 205 lb (93 kg)   BMI 29.41 kg/m       Objective:   Physical Exam  Constitutional: He is oriented to person, place, and time. He appears well-developed and well-nourished. No distress.  Cardiovascular: Normal rate, regular rhythm, normal heart sounds and intact distal pulses. Exam reveals no gallop and no friction rub.  No murmur heard. Pulmonary/Chest: Effort normal and breath sounds normal. No respiratory distress. He has no wheezes.  He has no rales. He exhibits no tenderness.  Abdominal: Soft. Bowel sounds are normal. He exhibits no distension and no mass. There is no tenderness. There is no rebound and no guarding.  Musculoskeletal: He exhibits tenderness. He exhibits no edema.  Discomfort with palpation to right trapezius. He has decreased ROM with horizontal movements of the head. No loss of ROM with verticle movements   Neurological: He is alert and oriented to person, place, and time.  Skin: Skin is warm and dry. No rash noted. He is not diaphoretic. No erythema. No pallor.  Psychiatric: He has a normal mood and affect. His behavior is normal. Judgment and thought content normal.  Nursing note and vitals reviewed.     Assessment & Plan:  1. Strain of neck muscle, initial encounter - Continue with motrin 600 mg Q8H PRN  - cyclobenzaprine (FLEXERIL) 10 MG tablet; Take 1 tablet (10 mg total) 2 (two) times daily as needed by mouth for muscle spasms.  Dispense: 30 tablet; Refill: 0 - Follow up if not resolved in the next 2-3 days   Dorothyann Peng, NP

## 2017-10-28 ENCOUNTER — Other Ambulatory Visit: Payer: Self-pay | Admitting: Adult Health

## 2017-10-29 NOTE — Telephone Encounter (Signed)
It is ok to wait that long. Fill for 3 months

## 2017-10-29 NOTE — Telephone Encounter (Signed)
Sent to the pharmacy by e-scribe. 

## 2017-10-29 NOTE — Telephone Encounter (Signed)
Pt has follow up scheduled on 01/10/18.  Ok to wait that long?

## 2017-11-01 ENCOUNTER — Ambulatory Visit: Payer: BLUE CROSS/BLUE SHIELD | Admitting: Adult Health

## 2017-12-02 ENCOUNTER — Other Ambulatory Visit: Payer: Self-pay | Admitting: Adult Health

## 2017-12-02 ENCOUNTER — Telehealth: Payer: Self-pay | Admitting: Adult Health

## 2017-12-02 NOTE — Telephone Encounter (Signed)
Per note by Dorothyann Peng dated 08/01/17 pt needed to schedule follow up visit in 3 months; no follow up scheduled; can this refill request be granted?

## 2017-12-02 NOTE — Telephone Encounter (Signed)
Copied from [place]. Topic: Quick Communication - See Telephone Encounter >> Dec 02, 2017  9:41 AM Boyd Kerbs wrote: CRM for notification. See Telephone encounter for:  Lisinopril prescription needed.  Told him to contact Pharmacy and he did not want to do it. Told him I would put in for this time but he needed to call pharmacy from now on. [facility] 12/02/17.

## 2017-12-03 MED ORDER — LISINOPRIL 20 MG PO TABS: 20.0000 mg | ORAL_TABLET | Freq: Every day | ORAL | 1 refills | Status: DC

## 2017-12-03 NOTE — Telephone Encounter (Signed)
Sent to the pharmacy by e-scribe. 

## 2018-01-09 ENCOUNTER — Other Ambulatory Visit: Payer: Self-pay | Admitting: Adult Health

## 2018-01-10 ENCOUNTER — Encounter: Payer: Self-pay | Admitting: Adult Health

## 2018-01-10 ENCOUNTER — Ambulatory Visit (INDEPENDENT_AMBULATORY_CARE_PROVIDER_SITE_OTHER): Payer: BLUE CROSS/BLUE SHIELD | Admitting: Adult Health

## 2018-01-10 VITALS — BP 104/68 | Temp 97.9°F | Wt 204.0 lb

## 2018-01-10 DIAGNOSIS — E1165 Type 2 diabetes mellitus with hyperglycemia: Secondary | ICD-10-CM

## 2018-01-10 DIAGNOSIS — I10 Essential (primary) hypertension: Secondary | ICD-10-CM | POA: Diagnosis not present

## 2018-01-10 LAB — POCT GLYCOSYLATED HEMOGLOBIN (HGB A1C): Hemoglobin A1C: 7.1

## 2018-01-10 MED ORDER — METFORMIN HCL 1000 MG PO TABS: ORAL_TABLET | ORAL | 1 refills | Status: DC

## 2018-01-10 NOTE — Progress Notes (Signed)
Subjective:    Patient ID: Ronald Pierce, male    DOB: 07-22-1961, 57 y.o.   MRN: [id number]  HPI  57 year old male who  has a past medical history of Diabetes mellitus without complication ([place]), Drug abuse ([place]), Hyperlipidemia, and Hypertension. He presents to the office today for 27-month follow-up regarding diabetes and hypertension.  His diabetes is currently maintained with metformin and glipizide.  His last office visit, 3 months ago he reported that he had finally started taking his medications as directed, his A1c has improved from 8.4 to 6.4  He was also complaining of episodes of lightheadedness and the office his blood pressure was 100/54.  The decision was made to decrease lisinopril from 40 mg to 30 mg  Today in the office he reports " I feel really good". He does report that he occasionally has a hypoglycemic event around 6 pm, he will eat a snack and feels better. He is not eating breakfast due to work schedule.   He is taking his Coreg in the morning but often does not take his nighttime dose.   Continues to have left shoulder pain but motrin works well for this.   BP Readings from Last 3 Encounters:  01/10/18 104/68  10/04/17 124/72  08/01/17 (!) 100/58   Wt Readings from Last 3 Encounters:  01/10/18 204 lb (92.5 kg)  10/04/17 205 lb (93 kg)  08/01/17 198 lb (89.8 kg)    Review of Systems See HPI   Past Medical History:  Diagnosis Date  . Diabetes mellitus without complication ([place])   . 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 Hyperlipidemia   . Hypertension     Social History   Socioeconomic History  . Marital status: Married    Spouse name: Not on file  . Number of children: Not on file  . Years of education: Not on file  . Highest education level: Not on file  Social Needs  . Financial resource strain: Not on file  . Food insecurity - worry: Not on file  . Food insecurity - inability: Not on file  . Transportation needs -  medical: Not on file  . Transportation needs - non-medical: Not on file  Occupational History  . Not on file  Tobacco Use  . Smoking status: Former Smoker    Types: Cigarettes    Last attempt to quit: 11/21/2012    Years since quitting: 5.1  . Smokeless tobacco: Never Used  Substance and Sexual Activity  . Alcohol use: Yes    Alcohol/week: 12.6 oz    Types: 21 Shots of liquor per week    Comment: beer on weekends  . Drug use: No  . Sexual activity: Not on file  Other Topics Concern  . Not on file  Social History Narrative   High school education    Works at [employer]   Married   One biological child     Past Surgical History:  Procedure Laterality Date  . left forearm tendon repair    . WRIST SURGERY      Family History  Problem Relation Age of Onset  . Diabetes Mother   . Hypertension Mother   . Diabetes Father   . Diabetes Maternal Grandmother   . Hypertension Maternal Grandmother   . Diabetes Maternal Grandfather   . Hypertension Maternal Grandfather   . Colon cancer Neg Hx     No Known Allergies  Current Outpatient Medications on File Prior to Visit  Medication Sig Dispense Refill  . carvedilol (COREG) 12.5 MG tablet TAKE ONE TABLET BY MOUTH TWICE DAILY WITH A MEAL 180 tablet 3  . ibuprofen (ADVIL,MOTRIN) 600 MG tablet Take 1 tablet (600 mg total) every 6 (six) hours as needed by mouth. Reported on 05/11/2016 30 tablet 0  . aspirin 81 MG chewable tablet Chew 1 tablet (81 mg total) by mouth daily. (Patient not taking: Reported on 01/10/2018) 30 tablet 0  . glipiZIDE (GLUCOTROL) 5 MG tablet TAKE 1 TABLET BY MOUTH DAILY BEFORE BREAKFAST 90 tablet 0  . hydrochlorothiazide (HYDRODIURIL) 25 MG tablet Take 1 tablet (25 mg total) by mouth daily. 90 tablet 3  . lisinopril (PRINIVIL,ZESTRIL) 20 MG tablet Take 1 tablet (20 mg total) by mouth daily. 90 tablet 1  . meloxicam (MOBIC) 15 MG tablet TAKE ONE TABLET BY MOUTH ONCE DAILY 30 tablet 3  . metFORMIN  (GLUCOPHAGE) 1000 MG tablet TAKE 1 TABLET BY MOUTH ONCE DAILY WITH  BREAKFAST 90 tablet 0  . pravastatin (PRAVACHOL) 20 MG tablet Take 1 tablet (20 mg total) by mouth daily. 90 tablet 3   No current facility-administered medications on file prior to visit.     BP 104/68 (BP Location: Left Arm)   Temp 97.9 F (36.6 C) (Oral)   Wt 204 lb (92.5 kg)   BMI 29.27 kg/m       Objective:   Physical Exam  Constitutional: He is oriented to person, place, and time. He appears well-developed and well-nourished. No distress.  Eyes: Conjunctivae and EOM are normal. Pupils are equal, round, and reactive to light. Right eye exhibits no discharge. Left eye exhibits no discharge. No scleral icterus.  Neck: Normal range of motion. Neck supple. No JVD present. No tracheal deviation present. No thyromegaly present.  Cardiovascular: Normal rate, regular rhythm, normal heart sounds and intact distal pulses. Exam reveals no gallop and no friction rub.  No murmur heard. Pulmonary/Chest: Effort normal and breath sounds normal. No stridor. No respiratory distress. He has no wheezes. He has no rales. He exhibits no tenderness.  Musculoskeletal: Normal range of motion. He exhibits tenderness (left shoulder ). He exhibits no edema or deformity.  Lymphadenopathy:    He has no cervical adenopathy.  Neurological: He is alert and oriented to person, place, and time. He has normal reflexes. He displays normal reflexes. No cranial nerve deficit. He exhibits normal muscle tone. Coordination normal.  Skin: Skin is warm and dry. No rash noted. He is not diaphoretic. No erythema. No pallor.  Psychiatric: He has a normal mood and affect. His behavior is normal. Judgment and thought content normal.  Nursing note and vitals reviewed.     Assessment & Plan:  1. Uncontrolled type 2 diabetes mellitus with hyperglycemia (HCC) - POC HgB A1c- 7.1, has not improved  - Continue with current treatment  - Needs to work on diet and  exercise   2. Essential hypertension - Will d/c coreg  - Will follow up with at [facility], NP

## 2018-01-10 NOTE — Progress Notes (Addendum)
Subjective:    Patient ID: Ronald Pierce, male    DOB: 1961/02/22, 57 y.o.   MRN: [id number]  Patient presents for chronic care and management of HTN and diabetes.  He is feeling well overall.  He does not check his blood sugar at home.  He has had some episodes of feeling flush and drowsy around 6 pm most days when he works.  He does have odd work hours and is not eating breakfast.  When this happens, he usually eats a snack and this resolves.  He is taking most of his meds as prescribed with the exception of his Coreg.  He is only taking that in the AM.  He admits that he forgets the evening dose.  Weight is stable.  He needs refills on his Ibuprofen.  Still having left shoulder pain from a previous injury.  He is still taking the prescription Ibuprofen on a regular basis. He previously underwent PT for this without much benefit.  This has been aggravated more by an increase in work duties.  He is not ready to see ortho yet and is happy with the ibuprofen therapy.  POC A1C is 7.1. He admits his diet has not as good as it should be. He has increased his "sweets".   Review of Systems  Constitutional: Negative.  Negative for activity change, appetite change, chills, diaphoresis, fatigue and fever.  HENT: Negative.   Respiratory: Negative.   Cardiovascular: Negative.   Gastrointestinal: Negative.   Endocrine: Negative.   Genitourinary: Negative.       Past Medical History:  Diagnosis Date  . Diabetes mellitus without complication ([place])   . 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 Hyperlipidemia   . Hypertension     Social History   Socioeconomic History  . Marital status: Married    Spouse name: Not on file  . Number of children: Not on file  . Years of education: Not on file  . Highest education level: Not on file  Social Needs  . Financial resource strain: Not on file  . Food insecurity - worry: Not on file  . Food insecurity - inability: Not on file  .  Transportation needs - medical: Not on file  . Transportation needs - non-medical: Not on file  Occupational History  . Not on file  Tobacco Use  . Smoking status: Former Smoker    Types: Cigarettes    Last attempt to quit: 11/21/2012    Years since quitting: 5.1  . Smokeless tobacco: Never Used  Substance and Sexual Activity  . Alcohol use: Yes    Alcohol/week: 12.6 oz    Types: 21 Shots of liquor per week    Comment: beer on weekends  . Drug use: No  . Sexual activity: Not on file  Other Topics Concern  . Not on file  Social History Narrative   High school education    Works at [employer]   Married   One biological child     Past Surgical History:  Procedure Laterality Date  . left forearm tendon repair    . WRIST SURGERY      Family History  Problem Relation Age of Onset  . Diabetes Mother   . Hypertension Mother   . Diabetes Father   . Diabetes Maternal Grandmother   . Hypertension Maternal Grandmother   . Diabetes Maternal Grandfather   . Hypertension Maternal Grandfather   . Colon cancer  Neg Hx     No Known Allergies  Current Outpatient Medications on File Prior to Visit  Medication Sig Dispense Refill  . carvedilol (COREG) 12.5 MG tablet TAKE ONE TABLET BY MOUTH TWICE DAILY WITH A MEAL 180 tablet 3  . aspirin 81 MG chewable tablet Chew 1 tablet (81 mg total) by mouth daily. (Patient not taking: Reported on 01/10/2018) 30 tablet 0  . glipiZIDE (GLUCOTROL) 5 MG tablet TAKE 1 TABLET BY MOUTH DAILY BEFORE BREAKFAST 90 tablet 0  . hydrochlorothiazide (HYDRODIURIL) 25 MG tablet Take 1 tablet (25 mg total) by mouth daily. 90 tablet 3  . ibuprofen (ADVIL,MOTRIN) 600 MG tablet TAKE 1 TABLET BY MOUTH EVERY 6 HOURS AS NEEDED 90 tablet 1  . lisinopril (PRINIVIL,ZESTRIL) 20 MG tablet Take 1 tablet (20 mg total) by mouth daily. 90 tablet 1  . meloxicam (MOBIC) 15 MG tablet TAKE ONE TABLET BY MOUTH ONCE DAILY 30 tablet 3  . metFORMIN (GLUCOPHAGE) 1000 MG tablet  TAKE 1 TABLET BY MOUTH ONCE DAILY WITH  BREAKFAST 90 tablet 0  . pravastatin (PRAVACHOL) 20 MG tablet Take 1 tablet (20 mg total) by mouth daily. 90 tablet 3   No current facility-administered medications on file prior to visit.     BP 104/68 (BP Location: Left Arm)   Temp 97.9 F (36.6 C) (Oral)   Wt 204 lb (92.5 kg)   BMI 29.27 kg/m   Objective:   Physical Exam  Constitutional: He appears well-developed and well-nourished. No distress.  Cardiovascular: Normal rate, regular rhythm, normal heart sounds and intact distal pulses. Exam reveals no gallop and no friction rub.  No murmur heard. Pulmonary/Chest: Effort normal and breath sounds normal. No respiratory distress.  Musculoskeletal:       Left shoulder: He exhibits decreased range of motion, tenderness and pain. He exhibits no swelling and no crepitus.      Assessment & Plan:  1. Uncontrolled type 2 diabetes mellitus with hyperglycemia (HCC) POC A1C 7.1 Continue with medications as prescribed.  Work on [employer], low sugar, low carb.  Increase exercise.  Will recheck A1C in June with physical.  - POC HgB A1c  2. Essential hypertension BP is on the low side.  He is only taking carvedilol once a day.  Will discontinue this medication and watch BP. Take BP at home twice a week and keep journal.   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

## 2018-01-10 NOTE — Patient Instructions (Signed)
I have stopped Coreg   I have sent in metformin and motrin for you   Follow up in June for physical

## 2018-01-26 ENCOUNTER — Other Ambulatory Visit: Payer: Self-pay | Admitting: Adult Health

## 2018-03-12 ENCOUNTER — Encounter: Payer: Self-pay | Admitting: Adult Health

## 2018-03-12 ENCOUNTER — Ambulatory Visit (INDEPENDENT_AMBULATORY_CARE_PROVIDER_SITE_OTHER): Payer: BLUE CROSS/BLUE SHIELD | Admitting: Adult Health

## 2018-03-12 VITALS — BP 118/70 | Temp 97.7°F | Wt 205.0 lb

## 2018-03-12 DIAGNOSIS — S161XXA Strain of muscle, fascia and tendon at neck level, initial encounter: Secondary | ICD-10-CM | POA: Diagnosis not present

## 2018-03-12 DIAGNOSIS — T148XXA Other injury of unspecified body region, initial encounter: Secondary | ICD-10-CM

## 2018-03-12 DIAGNOSIS — S46819A Strain of other muscles, fascia and tendons at shoulder and upper arm level, unspecified arm, initial encounter: Secondary | ICD-10-CM

## 2018-03-12 MED ORDER — METHYLPREDNISOLONE 4 MG PO TBPK: ORAL_TABLET | ORAL | 0 refills | Status: DC

## 2018-03-12 NOTE — Progress Notes (Signed)
Subjective:    Patient ID: Ronald Pierce, male    DOB: June 20, 1961, 57 y.o.   MRN: [id number]  HPI  57 year old male who  has a past medical history of Diabetes mellitus without complication ([place]), Drug abuse ([place]), Hyperlipidemia, and Hypertension. He presents to the office today for the acute complaint of right neck pain. Reports that pain has been present for three weeks. He has been using ASA, Motrin and Excedrin to help with the pain. Reports pain is worse in the morning. Shower helps . He has also been using a heating pad which " relaxes for a little bit of time"   Denies any heavy lifting. Has not noticed any fevers or blurred vision.   Pain is worse when looking down and to the right. Feels a " aching pulling" sensation    Review of Systems See HPI   Past Medical History:  Diagnosis Date  . Diabetes mellitus without complication ([place])   . 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 Hyperlipidemia   . Hypertension     Social History   Socioeconomic History  . Marital status: Married    Spouse name: Not on file  . Number of children: Not on file  . Years of education: Not on file  . Highest education level: Not on file  Occupational History  . Not on file  Social Needs  . Financial resource strain: Not on file  . Food insecurity:    Worry: Not on file    Inability: Not on file  . Transportation needs:    Medical: Not on file    Non-medical: Not on file  Tobacco Use  . Smoking status: Former Smoker    Types: Cigarettes    Last attempt to quit: 11/21/2012    Years since quitting: 5.3  . Smokeless tobacco: Never Used  Substance and Sexual Activity  . Alcohol use: Yes    Alcohol/week: 12.6 oz    Types: 21 Shots of liquor per week    Comment: beer on weekends  . Drug use: No  . Sexual activity: Not on file  Lifestyle  . Physical activity:    Days per week: Not on file    Minutes per session: Not on file  . Stress: Not on file    Relationships  . Social connections:    Talks on phone: Not on file    Gets together: Not on file    Attends religious service: Not on file    Active member of club or organization: Not on file    Attends meetings of clubs or organizations: Not on file    Relationship status: Not on file  . Intimate partner violence:    Fear of current or ex partner: Not on file    Emotionally abused: Not on file    Physically abused: Not on file    Forced sexual activity: Not on file  Other Topics Concern  . Not on file  Social History Narrative   High school education    Works at [employer]   Married   One biological child     Past Surgical History:  Procedure Laterality Date  . left forearm tendon repair    . WRIST SURGERY      Family History  Problem Relation Age of Onset  . Diabetes Mother   . Hypertension Mother   . Diabetes Father   . Diabetes Maternal Grandmother   .  Hypertension Maternal Grandmother   . Diabetes Maternal Grandfather   . Hypertension Maternal Grandfather   . Colon cancer Neg Hx     No Known Allergies  Current Outpatient Medications on File Prior to Visit  Medication Sig Dispense Refill  . aspirin 81 MG chewable tablet Chew 1 tablet (81 mg total) by mouth daily. 30 tablet 0  . glipiZIDE (GLUCOTROL) 5 MG tablet TAKE 1 TABLET BY MOUTH  DAILY BEFORE  BREAKFAST 90 tablet 0  . hydrochlorothiazide (HYDRODIURIL) 25 MG tablet Take 1 tablet (25 mg total) by mouth daily. 90 tablet 3  . ibuprofen (ADVIL,MOTRIN) 600 MG tablet TAKE 1 TABLET BY MOUTH EVERY 6 HOURS AS NEEDED 90 tablet 1  . lisinopril (PRINIVIL,ZESTRIL) 20 MG tablet Take 1 tablet (20 mg total) by mouth daily. 90 tablet 1  . meloxicam (MOBIC) 15 MG tablet TAKE ONE TABLET BY MOUTH ONCE DAILY 30 tablet 3  . metFORMIN (GLUCOPHAGE) 1000 MG tablet TAKE 1 TABLET BY MOUTH ONCE DAILY WITH  BREAKFAST 90 tablet 1  . pravastatin (PRAVACHOL) 20 MG tablet Take 1 tablet (20 mg total) by mouth daily. 90 tablet 3    No current facility-administered medications on file prior to visit.     BP 118/70   Temp 97.7 F (36.5 C) (Oral)   Wt 205 lb (93 kg)   BMI 29.41 kg/m       Objective:   Physical Exam  Constitutional: He is oriented to person, place, and time. He appears well-developed and well-nourished. No distress.  Cardiovascular: Normal rate, regular rhythm, normal heart sounds and intact distal pulses. Exam reveals no gallop and no friction rub.  No murmur heard. Pulmonary/Chest: Effort normal and breath sounds normal. No respiratory distress. He has no wheezes. He has no rales.  Musculoskeletal: Normal range of motion. He exhibits tenderness. He exhibits no edema or deformity.  Decreased ROM when looking horizontally to right. Pain with palpation along trapezius   Neurological: He is alert and oriented to person, place, and time. He has normal reflexes. He displays normal reflexes. No cranial nerve deficit. He exhibits normal muscle tone. Coordination normal.  Skin: Skin is warm and dry. No rash noted. He is not diaphoretic. No erythema. No pallor.  Psychiatric: He has a normal mood and affect. His behavior is normal. Judgment and thought content normal.  Nursing note and vitals reviewed.     Assessment & Plan:  1. Muscle strain - Advised to take Motrin. Can stop extra ASA and Excedrin  - Has muscle relaxers at home  - Advised to increase water intake while taking prednisone to help keep blood sugars low.  - methylPREDNISolone (MEDROL DOSEPAK) 4 MG TBPK tablet; Take as directed  Dispense: 21 tablet; Refill: 0 - Follow up if not resolved in 2-3 days   Dorothyann Peng, NP

## 2018-03-26 ENCOUNTER — Encounter (INDEPENDENT_AMBULATORY_CARE_PROVIDER_SITE_OTHER): Payer: Self-pay | Admitting: Orthopaedic Surgery

## 2018-03-26 ENCOUNTER — Ambulatory Visit (INDEPENDENT_AMBULATORY_CARE_PROVIDER_SITE_OTHER): Payer: Self-pay

## 2018-03-26 ENCOUNTER — Ambulatory Visit (INDEPENDENT_AMBULATORY_CARE_PROVIDER_SITE_OTHER): Payer: BLUE CROSS/BLUE SHIELD | Admitting: Orthopaedic Surgery

## 2018-03-26 ENCOUNTER — Telehealth (INDEPENDENT_AMBULATORY_CARE_PROVIDER_SITE_OTHER): Payer: Self-pay | Admitting: Orthopaedic Surgery

## 2018-03-26 VITALS — BP 135/85 | HR 74 | Ht 70.0 in | Wt 205.0 lb

## 2018-03-26 DIAGNOSIS — M542 Cervicalgia: Secondary | ICD-10-CM | POA: Diagnosis not present

## 2018-03-26 DIAGNOSIS — M4722 Other spondylosis with radiculopathy, cervical region: Secondary | ICD-10-CM

## 2018-03-26 DIAGNOSIS — M25511 Pain in right shoulder: Secondary | ICD-10-CM

## 2018-03-26 MED ORDER — DIAZEPAM 5 MG PO TABS: ORAL_TABLET | ORAL | 0 refills | Status: DC

## 2018-03-26 MED ORDER — TRAMADOL HCL 50 MG PO TABS: 50.0000 mg | ORAL_TABLET | Freq: Two times a day (BID) | ORAL | 0 refills | Status: DC | PRN

## 2018-03-26 NOTE — Telephone Encounter (Signed)
Patient called advised he spoke with the pharmacy and the Rx were not called in yet.  Patient asked for a call back when he can pick his Rx. The number to contact patient is [phone]

## 2018-03-26 NOTE — Telephone Encounter (Signed)
Melody ([facility] called left VM     Dia needed  to fill prescription for more than a 7 day supply

## 2018-03-26 NOTE — Telephone Encounter (Signed)
Medications were called in to [facility] when patient was still in the office this morning. I called [facility] and they have prescriptions ready. I left voicemail for patient advising.

## 2018-03-26 NOTE — Progress Notes (Signed)
Office Visit Note   Patient: Ronald Pierce           Date of Birth: Mar 31, 1961           MRN: [id number] Visit Date: 03/26/2018              Requested by: Dorothyann Peng, NP [place], [place] [address] PCP: Dorothyann Peng, NP   Assessment & Plan: Visit Diagnoses:  1. Neck pain   2. Acute pain of right shoulder   3. Other spondylosis with radiculopathy, cervical region     Plan: Patient already had anti-inflammatories, prednisone pack.  We will set him up for some home cervical traction.  Ultram prescribed that he can use intermittently for pain.  We will schedule him for a cervical MRI scan and see him back after the scan for review.  Follow-Up Instructions: No follow-ups on file.   Orders:  Orders Placed This Encounter  Procedures  . XR Cervical Spine 2 or 3 views  . XR Shoulder Right   No orders of the defined types were placed in this encounter.     Procedures: No procedures performed   Clinical Data: No additional findings.   Subjective: Chief Complaint  Patient presents with  . Neck - Pain  . Right Shoulder - Pain    HPI 57 year old male seen with neck and right shoulder pain that radiates up and sometimes causes posterior headaches which is been present for greater than 2 months.  He has been on a prednisone pack without relief is taken anti-inflammatories including Mobic and now currently is on ibuprofen also uses Excedrin.  He denies any left upper extremity symptoms.  Pain bothers him on a daily basis at times he states that it severe and incapacitating.  Patient works 2 jobs.  Review of Systems 14 point review of system positive for hypercholesterolemia.  Microalbuminemia type 2 diabetes on oral medications, hypertension.  Cervical spondylosis.  Otherwise negative as it pertains HPI.   Objective: Vital Signs: BP 135/85   Pulse 74   Ht 5\' 10"  (1.778 m)   Wt 205 lb (93 kg)   BMI 29.41 kg/m   Physical Exam  Constitutional: He  is oriented to person, place, and time. He appears well-developed and well-nourished.  HENT:  Head: Normocephalic and atraumatic.  Eyes: Pupils are equal, round, and reactive to light. EOM are normal.  Neck: No tracheal deviation present. No thyromegaly present.  Cardiovascular: Normal rate.  Pulmonary/Chest: Effort normal. He has no wheezes.  Abdominal: Soft. Bowel sounds are normal.  Neurological: He is alert and oriented to person, place, and time.  Skin: Skin is warm and dry. Capillary refill takes less than 2 seconds.  Psychiatric: He has a normal mood and affect. His behavior is normal. Judgment and thought content normal.    Ortho Exam patient has increased pain with cervical compression relief with distraction.  Brachial plexus tenderness on the right.  Upper extremity reflexes are 1+ and symmetrical.  Mild discomfort with shoulder impingement negative drop arm test no supraspinatus weakness no winging of the scapula.  No atrophy of the biceps triceps brachioradialis.  Interossei are strong.  Median nerve the forearm and wrist are normal.  Specialty Comments:  No specialty comments available.  Imaging: Xr Cervical Spine 2 Or 3 Views  Result Date: 03/26/2018 AP lateral cervical spine x-rays are obtained and reviewed.  This shows calcification in the anterior longitudinal ligament with large spurs without consolidation at C5-6 and C6-7.  Posterior endplate spurs at 4 5 and 5 6.  Uncovertebral degenerative changes and facet changes worse at C4-5 C5-6 on the right than left. Impression: Cervical spondylosis with degenerative facets C4-5 through C6-7.  Xr Shoulder Right  Result Date: 03/26/2018 Three-view x-rays right shoulder obtained and reviewed.  This shows acromioclavicular spurring with inferior hanging spurs.  Irregularity of the greater tuberosity of the humerus with spurring on the acromium consistent with impingement.  No glenohumeral arthritis. Impression: Right shoulder  acromioclavicular spurring which may contribute to impingement.  Negative for acute changes.    PMFS History: Patient Active Problem List   Diagnosis Date Noted  . HTN (hypertension) 05/11/2016  . Diabetes type 2, uncontrolled ([place]) 05/04/2016  . Shoulder pain, left 02/01/2016  . OBESITY 05/10/2010  . 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 HYPERCHOLESTEROLEMIA 02/01/2009   Past Medical History:  Diagnosis Date  . Diabetes mellitus without complication ([place])   . 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 Hyperlipidemia   . Hypertension     Family History  Problem Relation Age of Onset  . Diabetes Mother   . Hypertension Mother   . Diabetes Father   . Diabetes Maternal Grandmother   . Hypertension Maternal Grandmother   . Diabetes Maternal Grandfather   . Hypertension Maternal Grandfather   . Colon cancer Neg Hx     Past Surgical History:  Procedure Laterality Date  . left forearm tendon repair    . WRIST SURGERY     Social History   Occupational History  . Not on file  Tobacco Use  . Smoking status: Former Smoker    Types: Cigarettes    Last attempt to quit: 11/21/2012    Years since quitting: 5.3  . Smokeless tobacco: Never Used  Substance and Sexual Activity  . Alcohol use: Yes    Alcohol/week: 12.6 oz    Types: 21 Shots of liquor per week    Comment: beer on weekends  . Drug use: No  . Sexual activity: Not on file

## 2018-03-27 ENCOUNTER — Telehealth (INDEPENDENT_AMBULATORY_CARE_PROVIDER_SITE_OTHER): Payer: Self-pay | Admitting: Orthopaedic Surgery

## 2018-03-27 NOTE — Telephone Encounter (Signed)
I left voicemail for patient advising. 

## 2018-03-27 NOTE — Telephone Encounter (Signed)
I called and spoke with pharmacist. They will fill script for diagnosis of cervical spondylosis with radiculopathy.

## 2018-03-27 NOTE — Telephone Encounter (Signed)
Patient called advised he spoke with the pharmacy and was told the pharmacy have not received a call back yet. The number to contact the pharmacy is [phone]   [employer]or, [employer])  [facility]. The number to contact patient is [phone]

## 2018-04-07 ENCOUNTER — Ambulatory Visit
Admission: RE | Admit: 2018-04-07 | Discharge: 2018-04-07 | Disposition: A | Discharge status: Home / Self Care | Payer: BLUE CROSS/BLUE SHIELD | Source: Ambulatory Visit | Attending: Orthopaedic Surgery | Admitting: Orthopaedic Surgery

## 2018-04-07 DIAGNOSIS — M25511 Pain in right shoulder: Secondary | ICD-10-CM

## 2018-04-07 DIAGNOSIS — M4722 Other spondylosis with radiculopathy, cervical region: Secondary | ICD-10-CM

## 2018-04-07 DIAGNOSIS — M542 Cervicalgia: Secondary | ICD-10-CM

## 2018-04-16 ENCOUNTER — Ambulatory Visit (INDEPENDENT_AMBULATORY_CARE_PROVIDER_SITE_OTHER): Payer: BLUE CROSS/BLUE SHIELD | Admitting: Orthopaedic Surgery

## 2018-04-18 ENCOUNTER — Other Ambulatory Visit: Payer: Self-pay | Admitting: Adult Health

## 2018-04-18 DIAGNOSIS — E7800 Pure hypercholesterolemia, unspecified: Secondary | ICD-10-CM

## 2018-04-18 NOTE — Telephone Encounter (Signed)
Sent to the pharmacy by e-scribe for 90 days.  Pt has upcoming cpx on 05/09/18.

## 2018-05-09 ENCOUNTER — Encounter: Payer: BLUE CROSS/BLUE SHIELD | Admitting: Adult Health

## 2018-05-15 ENCOUNTER — Encounter: Payer: Self-pay | Admitting: Adult Health

## 2018-05-15 ENCOUNTER — Ambulatory Visit (INDEPENDENT_AMBULATORY_CARE_PROVIDER_SITE_OTHER): Payer: BLUE CROSS/BLUE SHIELD | Admitting: Adult Health

## 2018-05-15 VITALS — BP 122/72 | Temp 98.1°F | Ht 69.75 in | Wt 198.0 lb

## 2018-05-15 DIAGNOSIS — Z0000 Encounter for general adult medical examination without abnormal findings: Secondary | ICD-10-CM

## 2018-05-15 DIAGNOSIS — I10 Essential (primary) hypertension: Secondary | ICD-10-CM | POA: Diagnosis not present

## 2018-05-15 DIAGNOSIS — Z760 Encounter for issue of repeat prescription: Secondary | ICD-10-CM

## 2018-05-15 DIAGNOSIS — Z125 Encounter for screening for malignant neoplasm of prostate: Secondary | ICD-10-CM | POA: Diagnosis not present

## 2018-05-15 DIAGNOSIS — E1165 Type 2 diabetes mellitus with hyperglycemia: Secondary | ICD-10-CM

## 2018-05-15 DIAGNOSIS — E7800 Pure hypercholesterolemia, unspecified: Secondary | ICD-10-CM

## 2018-05-15 LAB — CBC WITH DIFFERENTIAL/PLATELET
Basophils Absolute: 0.1 10*3/uL (ref 0.0–0.1)
Basophils Relative: 0.8 % (ref 0.0–3.0)
Eosinophils Absolute: 0.2 10*3/uL (ref 0.0–0.7)
Eosinophils Relative: 2.1 % (ref 0.0–5.0)
HEMATOCRIT: 38.5 % — AB (ref 39.0–52.0)
HEMOGLOBIN: 12.9 g/dL — AB (ref 13.0–17.0)
LYMPHS PCT: 22.5 % (ref 12.0–46.0)
Lymphs Abs: 1.8 10*3/uL (ref 0.7–4.0)
MCHC: 33.4 g/dL (ref 30.0–36.0)
MCV: 83.2 fl (ref 78.0–100.0)
MONOS PCT: 10.1 % (ref 3.0–12.0)
Monocytes Absolute: 0.8 10*3/uL (ref 0.1–1.0)
Neutro Abs: 5.1 10*3/uL (ref 1.4–7.7)
Neutrophils Relative %: 64.5 % (ref 43.0–77.0)
Platelets: 428 10*3/uL — ABNORMAL HIGH (ref 150.0–400.0)
RBC: 4.63 Mil/uL (ref 4.22–5.81)
RDW: 14 % (ref 11.5–15.5)
WBC: 8 10*3/uL (ref 4.0–10.5)

## 2018-05-15 LAB — HEPATIC FUNCTION PANEL
ALBUMIN: 4.6 g/dL (ref 3.5–5.2)
ALK PHOS: 45 U/L (ref 39–117)
ALT: 32 U/L (ref 0–53)
AST: 23 U/L (ref 0–37)
BILIRUBIN DIRECT: 0.1 mg/dL (ref 0.0–0.3)
TOTAL PROTEIN: 8.6 g/dL — AB (ref 6.0–8.3)
Total Bilirubin: 0.4 mg/dL (ref 0.2–1.2)

## 2018-05-15 LAB — PSA: PSA: 0.73 ng/mL (ref 0.10–4.00)

## 2018-05-15 LAB — LIPID PANEL
CHOL/HDL RATIO: 4
Cholesterol: 168 mg/dL (ref 0–200)
HDL: 47.4 mg/dL (ref >39.00)
LDL Cholesterol: 88 mg/dL (ref 0–99)
NONHDL: 120.72
Triglycerides: 162 mg/dL — ABNORMAL HIGH (ref 0.0–149.0)
VLDL: 32.4 mg/dL (ref 0.0–40.0)

## 2018-05-15 LAB — BASIC METABOLIC PANEL
BUN: 30 mg/dL — ABNORMAL HIGH (ref 6–23)
CO2: 26 mEq/L (ref 19–32)
Calcium: 10.4 mg/dL (ref 8.4–10.5)
Chloride: 100 mEq/L (ref 96–112)
Creatinine, Ser: 1.24 mg/dL (ref 0.40–1.50)
GFR: 76.7 mL/min (ref >60.00)
GLUCOSE: 113 mg/dL — AB (ref 70–99)
POTASSIUM: 4.7 meq/L (ref 3.5–5.1)
SODIUM: 137 meq/L (ref 135–145)

## 2018-05-15 LAB — TSH: TSH: 2.27 u[IU]/mL (ref 0.35–4.50)

## 2018-05-15 LAB — HEMOGLOBIN A1C: Hgb A1c MFr Bld: 7.8 % — ABNORMAL HIGH (ref 4.6–6.5)

## 2018-05-15 MED ORDER — HYDROCHLOROTHIAZIDE 25 MG PO TABS: 25.0000 mg | ORAL_TABLET | Freq: Every day | ORAL | 3 refills | Status: DC

## 2018-05-15 MED ORDER — IBUPROFEN 600 MG PO TABS: 600.0000 mg | ORAL_TABLET | Freq: Four times a day (QID) | ORAL | 1 refills | Status: DC | PRN

## 2018-05-15 MED ORDER — LISINOPRIL 20 MG PO TABS: 20.0000 mg | ORAL_TABLET | Freq: Every day | ORAL | 3 refills | Status: DC

## 2018-05-15 NOTE — Progress Notes (Signed)
Subjective:    Patient ID: Ronald Pierce, male    DOB: Dec 28, 1960, 56 y.o.   MRN: [id number]  HPI Patient presents for yearly preventative medicine examination. He is a pleasant 57 year old male who  has a past medical history of Diabetes mellitus without complication ([place]), Drug abuse ([place]), Hyperlipidemia, and Hypertension.  DM -currently prescribed glipizide 5 mg and metformin 1000 mg twice daily. He does not check his blood sugars at home. Denies any feelings of hypoglycemia.  Lab Results  Component Value Date   HGBA1C 7.1 01/10/2018   Hyperlipidemia -currently prescribed pravastatin 20 mg daily and ASA 81 mg.  He tolerates this medication well. Lab Results  Component Value Date   CHOL 184 04/26/2017   HDL 44.80 04/26/2017   LDLCALC 107 (H) 04/26/2017   LDLDIRECT 85.0 09/09/2013   TRIG 164.0 (H) 04/26/2017   CHOLHDL 4 04/26/2017    Hypertension -controlled with lisinopril 20 mg and hydrochlorothiazide 25 mg BP Readings from Last 3 Encounters:  05/15/18 122/72  03/26/18 135/85  03/12/18 118/70   All immunizations and health maintenance protocols were reviewed with the patient and needed orders were placed.  Appropriate screening laboratory values were ordered for the patient including screening of hyperlipidemia, renal function and hepatic function. If indicated by BPH, a PSA was ordered.  Medication reconciliation,  past medical history, social history, problem list and allergies were reviewed in detail with the patient  Goals were established with regard to weight loss, exercise, and  diet in compliance with medications. He does not exercise on a regular basis but is active at his job. He tries to eat healthy.   He is up-to-date on routine health maintenance items such as colonoscopy. He has not been seen by his eye doctor or dentist this year but plans to do so.   Review of Systems  Constitutional: Negative.   HENT: Negative.   Eyes: Negative.   Respiratory:  Negative.   Cardiovascular: Negative.   Gastrointestinal: Negative.   Endocrine: Negative.   Genitourinary: Negative.   Musculoskeletal: Positive for arthralgias (bilateral shoulders).  Skin: Negative.   Allergic/Immunologic: Negative.   Neurological: Negative.   Hematological: Negative.   Psychiatric/Behavioral: Negative.   All other systems reviewed and are negative.  Past Medical History:  Diagnosis Date  . Diabetes mellitus without complication ([place])   . 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 Hyperlipidemia   . Hypertension     Social History   Socioeconomic History  . Marital status: Married    Spouse name: Not on file  . Number of children: Not on file  . Years of education: Not on file  . Highest education level: Not on file  Occupational History  . Not on file  Social Needs  . Financial resource strain: Not on file  . Food insecurity:    Worry: Not on file    Inability: Not on file  . Transportation needs:    Medical: Not on file    Non-medical: Not on file  Tobacco Use  . Smoking status: Former Smoker    Types: Cigarettes    Last attempt to quit: 11/21/2012    Years since quitting: 5.4  . Smokeless tobacco: Never Used  Substance and Sexual Activity  . Alcohol use: Yes    Alcohol/week: 12.6 oz    Types: 21 Shots of liquor per week    Comment: beer on weekends  . Drug use: No  .  Sexual activity: Not on file  Lifestyle  . Physical activity:    Days per week: Not on file    Minutes per session: Not on file  . Stress: Not on file  Relationships  . Social connections:    Talks on phone: Not on file    Gets together: Not on file    Attends religious service: Not on file    Active member of club or organization: Not on file    Attends meetings of clubs or organizations: Not on file    Relationship status: Not on file  . Intimate partner violence:    Fear of current or ex partner: Not on file    Emotionally abused: Not on file     Physically abused: Not on file    Forced sexual activity: Not on file  Other Topics Concern  . Not on file  Social History Narrative   High school education    Works at [employer]   Married   One biological child     Past Surgical History:  Procedure Laterality Date  . left forearm tendon repair    . WRIST SURGERY      Family History  Problem Relation Age of Onset  . Diabetes Mother   . Hypertension Mother   . Diabetes Father   . Diabetes Maternal Grandmother   . Hypertension Maternal Grandmother   . Diabetes Maternal Grandfather   . Hypertension Maternal Grandfather   . Colon cancer Neg Hx     No Known Allergies  Current Outpatient Medications on File Prior to Visit  Medication Sig Dispense Refill  . aspirin 81 MG chewable tablet Chew 1 tablet (81 mg total) by mouth daily. 30 tablet 0  . glipiZIDE (GLUCOTROL) 5 MG tablet TAKE 1 TABLET BY MOUTH ONCE DAILY BEFORE BREAKFAST 90 tablet 0  . hydrochlorothiazide (HYDRODIURIL) 25 MG tablet Take 1 tablet (25 mg total) by mouth daily. 90 tablet 3  . ibuprofen (ADVIL,MOTRIN) 600 MG tablet TAKE 1 TABLET BY MOUTH EVERY 6 HOURS AS NEEDED 90 tablet 1  . lisinopril (PRINIVIL,ZESTRIL) 20 MG tablet Take 1 tablet (20 mg total) by mouth daily. 90 tablet 1  . metFORMIN (GLUCOPHAGE) 1000 MG tablet TAKE 1 TABLET BY MOUTH ONCE DAILY WITH  BREAKFAST 90 tablet 1  . pravastatin (PRAVACHOL) 20 MG tablet TAKE 1 TABLET BY MOUTH ONCE DAILY 90 tablet 0   No current facility-administered medications on file prior to visit.     BP 122/72   Temp 98.1 F (36.7 C) (Oral)   Ht 5' 9.75" (1.772 m) Comment: WITH SHOES  Wt 198 lb (89.8 kg)   BMI 28.61 kg/m       Objective:   Physical Exam  Constitutional: He is oriented to person, place, and time. He appears well-developed and well-nourished. No distress.  Overweight   HENT:  Head: Normocephalic and atraumatic.  Right Ear: External ear normal.  Left Ear: External ear normal.  Nose: Nose  normal.  Mouth/Throat: Oropharynx is clear and moist. No oropharyngeal exudate.  Eyes: Pupils are equal, round, and reactive to light. Conjunctivae and EOM are normal. Right eye exhibits no discharge. Left eye exhibits no discharge. No scleral icterus.  Neck: Normal range of motion. Neck supple. No JVD present. No tracheal deviation present. No thyromegaly present.  Cardiovascular: Normal rate, regular rhythm, normal heart sounds and intact distal pulses. Exam reveals no gallop and no friction rub.  No murmur heard. Pulmonary/Chest: Effort normal and breath sounds normal. No  stridor. No respiratory distress. He has no wheezes. He has no rales. He exhibits no tenderness.  Abdominal: Soft. Bowel sounds are normal. He exhibits no distension and no mass. There is no tenderness. There is no rebound and no guarding. No hernia.  Musculoskeletal: Normal range of motion. He exhibits no edema, tenderness or deformity.  Lymphadenopathy:    He has no cervical adenopathy.  Neurological: He is alert and oriented to person, place, and time. He displays normal reflexes. No cranial nerve deficit or sensory deficit. He exhibits normal muscle tone. Coordination normal.  Skin: Skin is warm and dry. Capillary refill takes less than 2 seconds. No rash noted. He is not diaphoretic. No erythema. No pallor.  Psychiatric: He has a normal mood and affect. His behavior is normal. Judgment and thought content normal.  Nursing note and vitals reviewed.     Assessment & Plan:  1. Routine general medical examination at a health care facility - Encouraged weight loss with diet and exercise  - Follow up in one year or sooner if needed - Basic metabolic panel - CBC with Differential/Platelet - Hemoglobin A1c - Hepatic function panel - Lipid panel - TSH  2. Uncontrolled type 2 diabetes mellitus with hyperglycemia (HCC) - Consider increase in Glipizide - Follow up in three months  - Basic metabolic panel - CBC with  Differential/Platelet - Hemoglobin A1c - Hepatic function panel - Lipid panel - TSH  3. Essential hypertension - Well controlled. No change in medications  - Basic metabolic panel - CBC with Differential/Platelet - Hemoglobin A1c - Hepatic function panel - Lipid panel - TSH - hydrochlorothiazide (HYDRODIURIL) 25 MG tablet; Take 1 tablet (25 mg total) by mouth daily.  Dispense: 90 tablet; Refill: 3  4. HYPERCHOLESTEROLEMIA - Consider increase in statin  - Basic metabolic panel - CBC with Differential/Platelet - Hemoglobin A1c - Hepatic function panel - Lipid panel - TSH   5. Prostate cancer screening - PSA  6. Medication refill  - hydrochlorothiazide (HYDRODIURIL) 25 MG tablet; Take 1 tablet (25 mg total) by mouth daily.  Dispense: 90 tablet; Refill: 3 - lisinopril (PRINIVIL,ZESTRIL) 20 MG tablet; Take 1 tablet (20 mg total) by mouth daily.  Dispense: 90 tablet; Refill: 3 - ibuprofen (ADVIL,MOTRIN) 600 MG tablet; Take 1 tablet (600 mg total) by mouth every 6 (six) hours as needed.  Dispense: 90 tablet; Refill: 1   Dorothyann Peng, NP

## 2018-05-16 ENCOUNTER — Other Ambulatory Visit: Payer: Self-pay | Admitting: Family Medicine

## 2018-05-16 MED ORDER — GLIPIZIDE ER 10 MG PO TB24: 10.0000 mg | ORAL_TABLET | Freq: Every day | ORAL | 0 refills | Status: DC

## 2018-07-19 ENCOUNTER — Other Ambulatory Visit: Payer: Self-pay | Admitting: Adult Health

## 2018-07-19 DIAGNOSIS — E7800 Pure hypercholesterolemia, unspecified: Secondary | ICD-10-CM

## 2018-07-29 ENCOUNTER — Other Ambulatory Visit: Payer: Self-pay | Admitting: Adult Health

## 2018-07-29 DIAGNOSIS — E1165 Type 2 diabetes mellitus with hyperglycemia: Secondary | ICD-10-CM

## 2018-08-15 ENCOUNTER — Encounter: Payer: Self-pay | Admitting: Adult Health

## 2018-08-15 ENCOUNTER — Ambulatory Visit (INDEPENDENT_AMBULATORY_CARE_PROVIDER_SITE_OTHER): Payer: BLUE CROSS/BLUE SHIELD | Admitting: Adult Health

## 2018-08-15 VITALS — BP 126/70 | HR 78 | Temp 98.0°F | Wt 201.0 lb

## 2018-08-15 DIAGNOSIS — G8929 Other chronic pain: Secondary | ICD-10-CM | POA: Diagnosis not present

## 2018-08-15 DIAGNOSIS — M25511 Pain in right shoulder: Secondary | ICD-10-CM

## 2018-08-15 DIAGNOSIS — E1165 Type 2 diabetes mellitus with hyperglycemia: Secondary | ICD-10-CM

## 2018-08-15 DIAGNOSIS — M25512 Pain in left shoulder: Secondary | ICD-10-CM | POA: Diagnosis not present

## 2018-08-15 LAB — POCT GLYCOSYLATED HEMOGLOBIN (HGB A1C): HEMOGLOBIN A1C: 6.8 % — AB (ref 4.0–5.6)

## 2018-08-15 MED ORDER — METHYLPREDNISOLONE ACETATE 40 MG/ML IJ SUSP
40.0000 mg | Freq: Once | INTRAMUSCULAR | Status: AC
  Administered 2018-08-15: 40 mg via INTRA_ARTICULAR

## 2018-08-15 MED ORDER — METHYLPREDNISOLONE ACETATE 40 MG/ML IJ SUSP: 40.0000 mg | Freq: Once | INTRAMUSCULAR | Status: DC

## 2018-08-15 NOTE — Progress Notes (Signed)
Subjective:    Patient ID: Ronald Pierce, male    DOB: January 19, 1961, 57 y.o.   MRN: [id number]  HPI  57 year old male who  has a past medical history of Diabetes mellitus without complication ([place]), Drug abuse ([place]), Hyperlipidemia, and Hypertension. He presents to the office today for three month follow up regarding DM. He is currently maintained on Glipizide 10 mg XR daily and Metformin 1000 mg daily.  During last visit his A1c had gone from 7.1-7.8, at this time glipizide was increased from 5 mg to 10 mg extended release.  He does not monitor his blood sugars at home . He denies any hypoglycemic events.  Lab Results  Component Value Date   HGBA1C 7.8 (H) 05/15/2018   Additionally, today he would like to have a shoulder injection. He has chronic bilateral shoulder pain. Pain is worse with heavy lifting and reaching out motions. He has had a cortisone shot in the past and responded well to this.  Review of Systems See HPI   Past Medical History:  Diagnosis Date  . Diabetes mellitus without complication ([place])   . 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 Hyperlipidemia   . Hypertension     Social History   Socioeconomic History  . Marital status: Married    Spouse name: Not on file  . Number of children: Not on file  . Years of education: Not on file  . Highest education level: Not on file  Occupational History  . Not on file  Social Needs  . Financial resource strain: Not on file  . Food insecurity:    Worry: Not on file    Inability: Not on file  . Transportation needs:    Medical: Not on file    Non-medical: Not on file  Tobacco Use  . Smoking status: Former Smoker    Types: Cigarettes    Last attempt to quit: 11/21/2012    Years since quitting: 5.7  . Smokeless tobacco: Never Used  Substance and Sexual Activity  . Alcohol use: Yes    Alcohol/week: 21.0 standard drinks    Types: 21 Shots of liquor per week    Comment: beer on weekends  .  Drug use: No  . Sexual activity: Not on file  Lifestyle  . Physical activity:    Days per week: Not on file    Minutes per session: Not on file  . Stress: Not on file  Relationships  . Social connections:    Talks on phone: Not on file    Gets together: Not on file    Attends religious service: Not on file    Active member of club or organization: Not on file    Attends meetings of clubs or organizations: Not on file    Relationship status: Not on file  . Intimate partner violence:    Fear of current or ex partner: Not on file    Emotionally abused: Not on file    Physically abused: Not on file    Forced sexual activity: Not on file  Other Topics Concern  . Not on file  Social History Narrative   High school education    Works at [employer]   Married   One biological child     Past Surgical History:  Procedure Laterality Date  . left forearm tendon repair    . WRIST SURGERY      Family History  Problem Relation Age of Onset  . Diabetes Mother   . Hypertension Mother   . Diabetes Father   . Diabetes Maternal Grandmother   . Hypertension Maternal Grandmother   . Diabetes Maternal Grandfather   . Hypertension Maternal Grandfather   . Colon cancer Neg Hx     No Known Allergies  Current Outpatient Medications on File Prior to Visit  Medication Sig Dispense Refill  . aspirin 81 MG chewable tablet Chew 1 tablet (81 mg total) by mouth daily. 30 tablet 0  . glipiZIDE (GLUCOTROL XL) 10 MG 24 hr tablet Take 1 tablet (10 mg total) by mouth daily with breakfast. 90 tablet 0  . hydrochlorothiazide (HYDRODIURIL) 25 MG tablet Take 1 tablet (25 mg total) by mouth daily. 90 tablet 3  . ibuprofen (ADVIL,MOTRIN) 600 MG tablet Take 1 tablet (600 mg total) by mouth every 6 (six) hours as needed. 90 tablet 1  . lisinopril (PRINIVIL,ZESTRIL) 20 MG tablet Take 1 tablet (20 mg total) by mouth daily. 90 tablet 3  . metFORMIN (GLUCOPHAGE) 1000 MG tablet TAKE 1 TABLET BY MOUTH ONCE  DAILY WITH BREAKFAST 90 tablet 1  . pravastatin (PRAVACHOL) 20 MG tablet TAKE 1 TABLET BY MOUTH ONCE DAILY 90 tablet 0   No current facility-administered medications on file prior to visit.     There were no vitals taken for this visit.      Objective:   Physical Exam  Constitutional: He is oriented to person, place, and time. He appears well-developed and well-nourished. No distress.  Eyes: Pupils are equal, round, and reactive to light. Conjunctivae and EOM are normal.  Cardiovascular: Normal rate, regular rhythm, normal heart sounds and intact distal pulses.  Pulmonary/Chest: Effort normal and breath sounds normal.  Musculoskeletal: He exhibits no deformity.  Has full ROM of bilateral shoulders.   Neurological: He is alert and oriented to person, place, and time.  Skin: Skin is warm and dry. He is not diaphoretic.  Psychiatric: He has a normal mood and affect. His behavior is normal. Judgment and thought content normal.  Nursing note and vitals reviewed.     Assessment & Plan:  1. Uncontrolled type 2 diabetes mellitus with hyperglycemia (HCC)  - POC HgB A1c7- 7.6  - Has improved.  - No change in meds - 3 month follow up   2. Chronic pain of both shoulders - We will do a injection into the left shoulder today and if he responds well will do the right when he returns for follow up  Discussed risks and benefits of corticosteroid injection and patient consented.  After prepping skin with betadine, injected 40 mg depomedrol and 2 cc of plain xylocaine with 22 gauge one and one half inch needle using posterior approach and pt tolerated well.  - methylPREDNISolone acetate (DEPO-MEDROL) injection 40 mg  Dorothyann Peng, NP

## 2018-09-08 ENCOUNTER — Other Ambulatory Visit: Payer: Self-pay | Admitting: Adult Health

## 2018-09-09 NOTE — Telephone Encounter (Signed)
Sent to the pharmacy by e-scribe. 

## 2018-10-04 ENCOUNTER — Other Ambulatory Visit: Payer: Self-pay | Admitting: Adult Health

## 2018-10-04 DIAGNOSIS — E7800 Pure hypercholesterolemia, unspecified: Secondary | ICD-10-CM

## 2018-10-07 NOTE — Telephone Encounter (Signed)
Sent to the pharmacy by e-scribe. 

## 2018-11-12 ENCOUNTER — Ambulatory Visit: Payer: Self-pay | Admitting: Adult Health

## 2018-12-12 ENCOUNTER — Encounter: Payer: Self-pay | Admitting: Adult Health

## 2018-12-12 ENCOUNTER — Ambulatory Visit (INDEPENDENT_AMBULATORY_CARE_PROVIDER_SITE_OTHER): Payer: BLUE CROSS/BLUE SHIELD | Admitting: Adult Health

## 2018-12-12 VITALS — BP 120/80 | Temp 97.6°F | Wt 200.0 lb

## 2018-12-12 DIAGNOSIS — E1165 Type 2 diabetes mellitus with hyperglycemia: Secondary | ICD-10-CM | POA: Diagnosis not present

## 2018-12-12 DIAGNOSIS — Z760 Encounter for issue of repeat prescription: Secondary | ICD-10-CM

## 2018-12-12 LAB — POCT GLYCOSYLATED HEMOGLOBIN (HGB A1C): HbA1c, POC (controlled diabetic range): 6.8 % (ref 0.0–7.0)

## 2018-12-12 MED ORDER — GLIPIZIDE ER 10 MG PO TB24: ORAL_TABLET | ORAL | 1 refills | Status: DC

## 2018-12-12 MED ORDER — IBUPROFEN 600 MG PO TABS: 600.0000 mg | ORAL_TABLET | Freq: Four times a day (QID) | ORAL | 1 refills | Status: DC | PRN

## 2018-12-12 NOTE — Progress Notes (Signed)
Subjective:    Patient ID: Ronald Pierce, male    DOB: 1961-03-11, 58 y.o.   MRN: [id number]  HPI 58 year old male who  has a past medical history of Diabetes mellitus without complication ([place]), Drug abuse ([place]), Hyperlipidemia, and Hypertension.  He presents to the office today for three month follow up regarding DM. He is currently maintained on Glipzide 10 mg XR and Metformin 1000 mg daily. During his last visith his A1c has decreased from 7.8 to 6.8 .No medication changes were made. He does not check his blood sugars at home. He denies feelings of hypoglycemia. He reports that his diet was poor over the holidays. He continues to work two jobs so he eats a lot of food on the go. He is active at work but does not exercise outside of that   Wt Readings from Last 3 Encounters:  12/12/18 200 lb (90.7 kg)  08/15/18 201 lb (91.2 kg)  05/15/18 198 lb (89.8 kg)    Review of Systems See HPI   Past Medical History:  Diagnosis Date  . Diabetes mellitus without complication ([place])   . 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 Hyperlipidemia   . Hypertension     Social History   Socioeconomic History  . Marital status: Married    Spouse name: Not on file  . Number of children: Not on file  . Years of education: Not on file  . Highest education level: Not on file  Occupational History  . Not on file  Social Needs  . Financial resource strain: Not on file  . Food insecurity:    Worry: Not on file    Inability: Not on file  . Transportation needs:    Medical: Not on file    Non-medical: Not on file  Tobacco Use  . Smoking status: Former Smoker    Types: Cigarettes    Last attempt to quit: 11/21/2012    Years since quitting: 6.0  . Smokeless tobacco: Never Used  Substance and Sexual Activity  . Alcohol use: Yes    Alcohol/week: 21.0 standard drinks    Types: 21 Shots of liquor per week    Comment: beer on weekends  . Drug use: No  . Sexual activity:  Not on file  Lifestyle  . Physical activity:    Days per week: Not on file    Minutes per session: Not on file  . Stress: Not on file  Relationships  . Social connections:    Talks on phone: Not on file    Gets together: Not on file    Attends religious service: Not on file    Active member of club or organization: Not on file    Attends meetings of clubs or organizations: Not on file    Relationship status: Not on file  . Intimate partner violence:    Fear of current or ex partner: Not on file    Emotionally abused: Not on file    Physically abused: Not on file    Forced sexual activity: Not on file  Other Topics Concern  . Not on file  Social History Narrative   High school education    Works at [employer]   Married   One biological child     Past Surgical History:  Procedure Laterality Date  . left forearm tendon repair    . WRIST SURGERY      Family History  Problem Relation Age of Onset  . Diabetes Mother   . Hypertension Mother   . Diabetes Father   . Diabetes Maternal Grandmother   . Hypertension Maternal Grandmother   . Diabetes Maternal Grandfather   . Hypertension Maternal Grandfather   . Colon cancer Neg Hx     No Known Allergies  Current Outpatient Medications on File Prior to Visit  Medication Sig Dispense Refill  . aspirin 81 MG chewable tablet Chew 1 tablet (81 mg total) by mouth daily. 30 tablet 0  . glipiZIDE (GLUCOTROL XL) 10 MG 24 hr tablet TAKE 1 TABLET BY MOUTH ONCE DAILY WITH BREAKFAST 90 tablet 0  . hydrochlorothiazide (HYDRODIURIL) 25 MG tablet Take 1 tablet (25 mg total) by mouth daily. 90 tablet 3  . ibuprofen (ADVIL,MOTRIN) 600 MG tablet Take 1 tablet (600 mg total) by mouth every 6 (six) hours as needed. 90 tablet 1  . lisinopril (PRINIVIL,ZESTRIL) 20 MG tablet Take 1 tablet (20 mg total) by mouth daily. 90 tablet 3  . metFORMIN (GLUCOPHAGE) 1000 MG tablet TAKE 1 TABLET BY MOUTH ONCE DAILY WITH BREAKFAST 90 tablet 1  .  pravastatin (PRAVACHOL) 20 MG tablet TAKE 1 TABLET BY MOUTH ONCE DAILY 90 tablet 1   No current facility-administered medications on file prior to visit.     BP 120/80   Temp 97.6 F (36.4 C)   Wt 200 lb (90.7 kg)   BMI 28.90 kg/m       Objective:   Physical Exam Vitals signs and nursing note reviewed.  Constitutional:      Appearance: Normal appearance.  Cardiovascular:     Rate and Rhythm: Normal rate and regular rhythm.     Pulses: Normal pulses.     Heart sounds: Normal heart sounds.  Pulmonary:     Effort: Pulmonary effort is normal.     Breath sounds: Normal breath sounds.  Skin:    General: Skin is warm and dry.     Capillary Refill: Capillary refill takes less than 2 seconds.  Neurological:     General: No focal deficit present.     Mental Status: He is alert.  Psychiatric:        Mood and Affect: Mood normal.        Behavior: Behavior normal.        Thought Content: Thought content normal.        Judgment: Judgment normal.       Assessment & Plan:  1. Uncontrolled type 2 diabetes mellitus with hyperglycemia (HCC)  - POCT A1C- 6.8 - no change  - Will have him follow up in 6 months at [facility]  - Work on weight loss through diet and exercise  2. Medication refill  - ibuprofen (ADVIL,MOTRIN) 600 MG tablet; Take 1 tablet (600 mg total) by mouth every 6 (six) hours as needed.  Dispense: 90 tablet; Refill: 1  Dorothyann Peng, NP

## 2018-12-12 NOTE — Patient Instructions (Signed)
It was great seeing you today   Your A1c is 6.8- this is the same as it was three months ago   Please follow up in June for your physical

## 2018-12-25 DIAGNOSIS — E119 Type 2 diabetes mellitus without complications: Secondary | ICD-10-CM | POA: Diagnosis not present

## 2018-12-25 LAB — HM DIABETES EYE EXAM

## 2019-01-17 ENCOUNTER — Other Ambulatory Visit: Payer: Self-pay | Admitting: Adult Health

## 2019-01-17 DIAGNOSIS — E1165 Type 2 diabetes mellitus with hyperglycemia: Secondary | ICD-10-CM

## 2019-01-20 NOTE — Telephone Encounter (Signed)
Sent to the pharmacy by e-scribe for 6 months.  Pt has upcoming cpx on 06/04/2019.

## 2019-01-29 ENCOUNTER — Encounter: Payer: Self-pay | Admitting: Family Medicine

## 2019-04-13 ENCOUNTER — Other Ambulatory Visit: Payer: Self-pay | Admitting: Adult Health

## 2019-04-13 DIAGNOSIS — E7800 Pure hypercholesterolemia, unspecified: Secondary | ICD-10-CM

## 2019-04-13 NOTE — Telephone Encounter (Signed)
Sent to the pharmacy by e-scribe. 

## 2019-05-28 ENCOUNTER — Other Ambulatory Visit: Payer: Self-pay | Admitting: Adult Health

## 2019-05-28 DIAGNOSIS — I10 Essential (primary) hypertension: Secondary | ICD-10-CM

## 2019-05-28 DIAGNOSIS — Z760 Encounter for issue of repeat prescription: Secondary | ICD-10-CM

## 2019-05-28 NOTE — Telephone Encounter (Signed)
Sent to the pharmacy by e-scribe for 90 days.  Pt is scheduled for cpx on 06/04/2019.

## 2019-06-04 ENCOUNTER — Encounter: Payer: Self-pay | Admitting: Adult Health

## 2019-06-04 ENCOUNTER — Ambulatory Visit (INDEPENDENT_AMBULATORY_CARE_PROVIDER_SITE_OTHER): Payer: BC Managed Care – PPO | Admitting: Adult Health

## 2019-06-04 ENCOUNTER — Other Ambulatory Visit: Payer: Self-pay

## 2019-06-04 VITALS — BP 130/78 | HR 84 | Temp 98.8°F | Wt 197.0 lb

## 2019-06-04 DIAGNOSIS — I10 Essential (primary) hypertension: Secondary | ICD-10-CM | POA: Diagnosis not present

## 2019-06-04 DIAGNOSIS — E1165 Type 2 diabetes mellitus with hyperglycemia: Secondary | ICD-10-CM

## 2019-06-04 DIAGNOSIS — Z0000 Encounter for general adult medical examination without abnormal findings: Secondary | ICD-10-CM

## 2019-06-04 DIAGNOSIS — Z760 Encounter for issue of repeat prescription: Secondary | ICD-10-CM

## 2019-06-04 DIAGNOSIS — Z125 Encounter for screening for malignant neoplasm of prostate: Secondary | ICD-10-CM | POA: Diagnosis not present

## 2019-06-04 DIAGNOSIS — E7800 Pure hypercholesterolemia, unspecified: Secondary | ICD-10-CM | POA: Diagnosis not present

## 2019-06-04 LAB — COMPREHENSIVE METABOLIC PANEL
ALT: 21 U/L (ref 0–53)
AST: 18 U/L (ref 0–37)
Albumin: 4.6 g/dL (ref 3.5–5.2)
Alkaline Phosphatase: 40 U/L (ref 39–117)
BUN: 29 mg/dL — ABNORMAL HIGH (ref 6–23)
CO2: 27 mEq/L (ref 19–32)
Calcium: 10.5 mg/dL (ref 8.4–10.5)
Chloride: 99 mEq/L (ref 96–112)
Creatinine, Ser: 1.08 mg/dL (ref 0.40–1.50)
GFR: 84.33 mL/min (ref >60.00)
Glucose, Bld: 147 mg/dL — ABNORMAL HIGH (ref 70–99)
Potassium: 4.7 mEq/L (ref 3.5–5.1)
Sodium: 134 mEq/L — ABNORMAL LOW (ref 135–145)
Total Bilirubin: 0.4 mg/dL (ref 0.2–1.2)
Total Protein: 8.6 g/dL — ABNORMAL HIGH (ref 6.0–8.3)

## 2019-06-04 LAB — LIPID PANEL
Cholesterol: 156 mg/dL (ref 0–200)
HDL: 49.4 mg/dL (ref >39.00)
LDL Cholesterol: 82 mg/dL (ref 0–99)
NonHDL: 106.53
Total CHOL/HDL Ratio: 3
Triglycerides: 123 mg/dL (ref 0.0–149.0)
VLDL: 24.6 mg/dL (ref 0.0–40.0)

## 2019-06-04 LAB — CBC WITH DIFFERENTIAL/PLATELET
Basophils Absolute: 0.1 10*3/uL (ref 0.0–0.1)
Basophils Relative: 0.9 % (ref 0.0–3.0)
Eosinophils Absolute: 0.1 10*3/uL (ref 0.0–0.7)
Eosinophils Relative: 1.5 % (ref 0.0–5.0)
HCT: 41.5 % (ref 39.0–52.0)
Hemoglobin: 13.6 g/dL (ref 13.0–17.0)
Lymphocytes Relative: 24.9 % (ref 12.0–46.0)
Lymphs Abs: 2.1 10*3/uL (ref 0.7–4.0)
MCHC: 32.9 g/dL (ref 30.0–36.0)
MCV: 83.6 fl (ref 78.0–100.0)
Monocytes Absolute: 0.9 10*3/uL (ref 0.1–1.0)
Monocytes Relative: 10.7 % (ref 3.0–12.0)
Neutro Abs: 5.3 10*3/uL (ref 1.4–7.7)
Neutrophils Relative %: 62 % (ref 43.0–77.0)
Platelets: 423 10*3/uL — ABNORMAL HIGH (ref 150.0–400.0)
RBC: 4.96 Mil/uL (ref 4.22–5.81)
RDW: 13.8 % (ref 11.5–15.5)
WBC: 8.5 10*3/uL (ref 4.0–10.5)

## 2019-06-04 LAB — HEMOGLOBIN A1C: Hgb A1c MFr Bld: 8.1 % — ABNORMAL HIGH (ref 4.6–6.5)

## 2019-06-04 LAB — PSA: PSA: 0.7 ng/mL (ref 0.10–4.00)

## 2019-06-04 LAB — TSH: TSH: 2.4 u[IU]/mL (ref 0.35–4.50)

## 2019-06-04 MED ORDER — HYDROCHLOROTHIAZIDE 25 MG PO TABS: 25.0000 mg | ORAL_TABLET | Freq: Every day | ORAL | 3 refills | Status: DC

## 2019-06-04 MED ORDER — GLIPIZIDE ER 10 MG PO TB24: ORAL_TABLET | ORAL | 1 refills | Status: DC

## 2019-06-04 NOTE — Progress Notes (Signed)
Subjective:    Patient ID: Ronald Pierce, male    DOB: Oct 04, 1961, 58 y.o.   MRN: [id number]  HPI Patient presents for yearly preventative medicine examination. He is a pleasant 58 year old male who  has a past medical history of Diabetes mellitus without complication ([place]), Drug abuse ([place]), Hyperlipidemia, and Hypertension.  DM-he is maintained on glipizide 10 mg extended release and metformin 1000 mg daily.  Unfortunately, he does not check his blood sugars at home.  He denies feelings of hypoglycemia.  He is active at work but does not exercise outside of that. He does work two jobs and " eats on the run". He has been staying away from fast foods and  Lab Results  Component Value Date   HGBA1C 6.8 12/12/2018   Essential Hypertension -controlled with triple therapy of lisinopril 40 mg, hydrochlorothiazide 25 mg, and Coreg 12.5 mg.  Denies chest pain, shortness of breath, headaches, or blurred vision BP Readings from Last 3 Encounters:  06/04/19 130/78  12/12/18 120/80  08/15/18 126/70   Hyperlipidemia -controlled with pravastatin 20 mg and aspirin 81 mg Lab Results  Component Value Date   CHOL 168 05/15/2018   HDL 47.40 05/15/2018   LDLCALC 88 05/15/2018   LDLDIRECT 85.0 09/09/2013   TRIG 162.0 (H) 05/15/2018   CHOLHDL 4 05/15/2018     All immunizations and health maintenance protocols were reviewed with the patient and needed orders were placed. UTD on vaccinations  Appropriate screening laboratory values were ordered for the patient including screening of hyperlipidemia, renal function and hepatic function. If indicated by BPH, a PSA was ordered.  Medication reconciliation,  past medical history, social history, problem list and allergies were reviewed in detail with the patient  Goals were established with regard to weight loss, exercise, and  diet in compliance with medications  Wt Readings from Last 3 Encounters:  06/04/19 197 lb (89.4 kg)  12/12/18 200 lb (90.7  kg)  08/15/18 201 lb (91.2 kg)    End of life planning was discussed.  He is up-to-date on routine screening colonoscopies, dental and vision exams.   Review of Systems  Constitutional: Negative.   HENT: Negative.   Eyes: Negative.   Respiratory: Negative.   Cardiovascular: Negative.   Gastrointestinal: Negative.   Endocrine: Negative.   Genitourinary: Negative.   Musculoskeletal: Positive for arthralgias (bilateral shoulders).  Skin: Negative.   Allergic/Immunologic: Negative.   Neurological: Negative.   Hematological: Negative.   Psychiatric/Behavioral: Negative.   All other systems reviewed and are negative.  Past Medical History:  Diagnosis Date  . Diabetes mellitus without complication ([place])   . 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 Hyperlipidemia   . Hypertension     Social History   Socioeconomic History  . Marital status: Married    Spouse name: Not on file  . Number of children: Not on file  . Years of education: Not on file  . Highest education level: Not on file  Occupational History  . Not on file  Social Needs  . Financial resource strain: Not on file  . Food insecurity    Worry: Not on file    Inability: Not on file  . Transportation needs    Medical: Not on file    Non-medical: Not on file  Tobacco Use  . Smoking status: Former Smoker    Types: Cigarettes    Quit date: 11/21/2012    Years since  quitting: 6.5  . Smokeless tobacco: Never Used  Substance and Sexual Activity  . Alcohol use: Yes    Alcohol/week: 21.0 standard drinks    Types: 21 Shots of liquor per week    Comment: beer on weekends  . Drug use: No  . Sexual activity: Not on file  Lifestyle  . Physical activity    Days per week: Not on file    Minutes per session: Not on file  . Stress: Not on file  Relationships  . Social Herbalist on phone: Not on file    Gets together: Not on file    Attends religious service: Not on file    Active  member of club or organization: Not on file    Attends meetings of clubs or organizations: Not on file    Relationship status: Not on file  . Intimate partner violence    Fear of current or ex partner: Not on file    Emotionally abused: Not on file    Physically abused: Not on file    Forced sexual activity: Not on file  Other Topics Concern  . Not on file  Social History Narrative   High school education    Works at [employer]   Married   One biological child     Past Surgical History:  Procedure Laterality Date  . left forearm tendon repair    . WRIST SURGERY      Family History  Problem Relation Age of Onset  . Diabetes Mother   . Hypertension Mother   . Diabetes Father   . Diabetes Maternal Grandmother   . Hypertension Maternal Grandmother   . Diabetes Maternal Grandfather   . Hypertension Maternal Grandfather   . Colon cancer Neg Hx     No Known Allergies  Current Outpatient Medications on File Prior to Visit  Medication Sig Dispense Refill  . aspirin 81 MG chewable tablet Chew 1 tablet (81 mg total) by mouth daily. 30 tablet 0  . glipiZIDE (GLUCOTROL XL) 10 MG 24 hr tablet TAKE 1 TABLET BY MOUTH ONCE DAILY WITH BREAKFAST 90 tablet 1  . hydrochlorothiazide (HYDRODIURIL) 25 MG tablet Take 1 tablet (25 mg total) by mouth daily. 90 tablet 3  . ibuprofen (ADVIL,MOTRIN) 600 MG tablet Take 1 tablet (600 mg total) by mouth every 6 (six) hours as needed. 90 tablet 1  . lisinopril (ZESTRIL) 20 MG tablet Take 1 tablet by mouth once daily 90 tablet 0  . metFORMIN (GLUCOPHAGE) 1000 MG tablet TAKE 1 TABLET BY MOUTH ONCE DAILY WITH BREAKFAST 90 tablet 1  . pravastatin (PRAVACHOL) 20 MG tablet Take 1 tablet by mouth once daily 90 tablet 0   No current facility-administered medications on file prior to visit.     BP 130/78 (BP Location: Left Arm, Patient Position: Sitting, Cuff Size: Normal)   Pulse 84   Temp 98.8 F (37.1 C) (Oral)   Wt 197 lb (89.4 kg)   SpO2 96%    BMI 28.47 kg/m       Objective:   Physical Exam Vitals signs and nursing note reviewed.  Constitutional:      General: He is not in acute distress.    Appearance: He is not diaphoretic.  HENT:     Head: Normocephalic and atraumatic.     Right Ear: Tympanic membrane, ear canal and external ear normal. There is no impacted cerumen.     Left Ear: Tympanic membrane, ear canal and external ear  normal. There is no impacted cerumen.     Nose: Nose normal. No congestion or rhinorrhea.     Mouth/Throat:     Mouth: Mucous membranes are moist.     Pharynx: Oropharynx is clear. No oropharyngeal exudate or posterior oropharyngeal erythema.  Eyes:     General: No scleral icterus.       Right eye: No discharge.        Left eye: No discharge.     Extraocular Movements: Extraocular movements intact.     Conjunctiva/sclera: Conjunctivae normal.     Pupils: Pupils are equal, round, and reactive to light.  Neck:     Musculoskeletal: Normal range of motion and neck supple.     Thyroid: No thyromegaly.     Vascular: No JVD.     Trachea: No tracheal deviation.  Cardiovascular:     Rate and Rhythm: Normal rate and regular rhythm.     Pulses: Normal pulses.     Heart sounds: Normal heart sounds. No murmur. No friction rub. No gallop.   Pulmonary:     Effort: Pulmonary effort is normal. No respiratory distress.     Breath sounds: Normal breath sounds. No stridor. No wheezing, rhonchi or rales.  Chest:     Chest wall: No tenderness.  Abdominal:     General: Abdomen is flat. Bowel sounds are normal. There is no distension.     Palpations: Abdomen is soft. There is no mass.     Tenderness: There is no abdominal tenderness. There is no right CVA tenderness, left CVA tenderness, guarding or rebound.     Hernia: No hernia is present.  Musculoskeletal: Normal range of motion.        General: No swelling, tenderness, deformity or signs of injury.     Right lower leg: No edema.     Left lower leg: No  edema.  Lymphadenopathy:     Cervical: No cervical adenopathy.  Skin:    General: Skin is warm and dry.     Capillary Refill: Capillary refill takes less than 2 seconds.     Coloration: Skin is not jaundiced or pale.     Findings: No bruising, erythema, lesion or rash.  Neurological:     General: No focal deficit present.     Mental Status: He is alert and oriented to person, place, and time. Mental status is at baseline.     Cranial Nerves: No cranial nerve deficit.     Motor: No weakness or abnormal muscle tone.     Coordination: Coordination normal.     Gait: Gait normal.     Deep Tendon Reflexes: Reflexes are normal and symmetric. Reflexes normal.  Psychiatric:        Mood and Affect: Mood normal.        Thought Content: Thought content normal.        Judgment: Judgment normal.        Assessment & Plan:  1. Routine general medical examination at a health care facility - Follow up in one year or next CPE  - CBC with Differential/Platelet - Comprehensive metabolic panel - Hemoglobin A1c - Lipid panel - TSH  2. Essential hypertension - Well controlled. No change  - Advised low salt diet and heart healthy diet  - CBC with Differential/Platelet - Comprehensive metabolic panel - Hemoglobin A1c - Lipid panel - TSH - hydrochlorothiazide (HYDRODIURIL) 25 MG tablet; Take 1 tablet (25 mg total) by mouth daily.  Dispense: 90 tablet; Refill: 3  3. Uncontrolled type 2 diabetes mellitus with hyperglycemia (HCC) -Consider increase in medication - Likely 6 month return   - CBC with Differential/Platelet - Comprehensive metabolic panel - Hemoglobin A1c - Lipid panel - TSH - glipiZIDE (GLUCOTROL XL) 10 MG 24 hr tablet; TAKE 1 TABLET BY MOUTH ONCE DAILY WITH BREAKFAST  Dispense: 90 tablet; Refill: 1  4. HYPERCHOLESTEROLEMIA - Consider increase in statin  - CBC with Differential/Platelet - Comprehensive metabolic panel - Hemoglobin A1c - Lipid panel - TSH  5. Prostate  cancer screening  - PSA  Dorothyann Peng, NP

## 2019-06-05 ENCOUNTER — Telehealth: Payer: Self-pay | Admitting: Adult Health

## 2019-06-05 NOTE — Telephone Encounter (Signed)
Copied from [place] [id number]. Topic: General - Inquiry >> Jun 05, 2019  8:59 AM Mathis Bud wrote: Reason for CRM: Patient states Ronald Pierce called and is returning call [phone]

## 2019-06-05 NOTE — Telephone Encounter (Signed)
Attempted to reach pt, no answer. Left vm to call back  

## 2019-06-05 NOTE — Telephone Encounter (Signed)
Patient called back to get lab results.  Patient did state that he would like VM if that is okay. Patient is at work and may not be able to answer the phone Call back [phone]

## 2019-06-05 NOTE — Telephone Encounter (Signed)
Pt calling back for labs. Please advise   Copied from [place] [id number]. Topic: Quick Communication - Lab Results (Clinic Use ONLY) >> Jun 04, 2019  3:57 PM Miles Costain T, CMA wrote: Called patient to inform them of all lab results from 06/04/2019. When patient returns call, triage nurse may disclose results.

## 2019-06-05 NOTE — Telephone Encounter (Signed)
Copied from [place] [id number]. Topic: Quick Communication - Lab Results (Clinic Use ONLY) >> Jun 04, 2019  3:57 PM Miles Costain T, CMA wrote: Called patient to inform them of all lab results from 06/04/2019. When patient returns call, triage nurse may disclose results. >> Jun 05, 2019  4:27 PM Yvette Rack wrote: Pt called for lab results. Pt requests call back.

## 2019-06-05 NOTE — Telephone Encounter (Signed)
Called Ronald Pierce and gave him his results per [facility]. Ronald Pierce agreed to increase his medication. He had two questions, 1. Was someone sending him in a new rx with new instructions or just wait till he runs out of his current rx and 2. Does he need to schedule a lab appointment for repeat lab.  Forwarding note to pcp's office   Notes recorded by Dorothyann Peng, NP on 06/04/2019 at 12:29 PM EDT  Labs are stable.   His cholesterol panel has improved over the last year.   His A1c is up to 8.1. I would like him to increase Metformin to 1000 mg BID - follow up in 3 months

## 2019-06-09 ENCOUNTER — Other Ambulatory Visit: Payer: Self-pay | Admitting: Family Medicine

## 2019-06-09 DIAGNOSIS — E1165 Type 2 diabetes mellitus with hyperglycemia: Secondary | ICD-10-CM

## 2019-06-09 MED ORDER — METFORMIN HCL 1000 MG PO TABS: 1000.0000 mg | ORAL_TABLET | Freq: Two times a day (BID) | ORAL | 0 refills | Status: DC

## 2019-06-09 NOTE — Telephone Encounter (Signed)
Noted  Duplicate message  

## 2019-06-09 NOTE — Telephone Encounter (Signed)
Medication increased to 1000 mg BID.  Sent to the pharmacy by e-scribe.

## 2019-06-09 NOTE — Telephone Encounter (Signed)
Spoke to the pt and advised that I have sent in a new rx.  Will call him in 3 months to set him up for A1C in lab and doxy follow up.  Reminder set.  Nothing further needed.

## 2019-07-09 ENCOUNTER — Telehealth: Payer: Self-pay | Admitting: Adult Health

## 2019-07-09 NOTE — Telephone Encounter (Signed)
See request °

## 2019-07-09 NOTE — Telephone Encounter (Signed)
Medication Refill - Medication: hydrochlorothiazide (HYDRODIURIL) 25 MG tablet  Has the patient contacted their pharmacy? Yes - states they haven't heard from [place]. Pt is completely out (Agent: If no, request that the patient contact the pharmacy for the refill.) (Agent: If yes, when and what did the pharmacy advise?)  Preferred Pharmacy (with phone number or street name):  [facility] ([place]),  - [address] [phone] (Phone) [phone] (Fax)     Agent: Please be advised that RX refills may take up to 3 business days. We ask that you follow-up with your pharmacy.

## 2019-07-10 ENCOUNTER — Other Ambulatory Visit: Payer: Self-pay | Admitting: Adult Health

## 2019-07-10 DIAGNOSIS — E7800 Pure hypercholesterolemia, unspecified: Secondary | ICD-10-CM

## 2019-07-14 NOTE — Telephone Encounter (Signed)
Sent to the pharmacy by e-scribe. 

## 2019-07-14 NOTE — Telephone Encounter (Signed)
Called and spoke to the pharmacy.  They said the prescription was filled and picked up on 07/06/2019.

## 2019-08-18 ENCOUNTER — Other Ambulatory Visit: Payer: Self-pay | Admitting: Adult Health

## 2019-08-18 DIAGNOSIS — E1165 Type 2 diabetes mellitus with hyperglycemia: Secondary | ICD-10-CM

## 2019-08-19 NOTE — Telephone Encounter (Signed)
Denied.  I have scheduled the pt for follow up A1C.  He stated he does not need a refill at this time.  Has only been able to take 1 metformin per day.  He tried taking 2 tabs and 1.5 tabs and they made him feel "funny and not good."

## 2019-08-19 NOTE — Telephone Encounter (Signed)
Needs to be scheduled for A1C and follow up

## 2019-08-28 ENCOUNTER — Other Ambulatory Visit: Payer: Self-pay | Admitting: Adult Health

## 2019-08-28 DIAGNOSIS — I10 Essential (primary) hypertension: Secondary | ICD-10-CM

## 2019-08-28 DIAGNOSIS — Z760 Encounter for issue of repeat prescription: Secondary | ICD-10-CM

## 2019-09-01 NOTE — Telephone Encounter (Signed)
SENT TO THE PHARMACY BY E-SCRIBE. 

## 2019-09-08 ENCOUNTER — Other Ambulatory Visit: Payer: Self-pay

## 2019-09-08 ENCOUNTER — Telehealth (INDEPENDENT_AMBULATORY_CARE_PROVIDER_SITE_OTHER): Payer: BC Managed Care – PPO | Admitting: Adult Health

## 2019-09-08 DIAGNOSIS — Z20828 Contact with and (suspected) exposure to other viral communicable diseases: Secondary | ICD-10-CM | POA: Diagnosis not present

## 2019-09-08 DIAGNOSIS — R197 Diarrhea, unspecified: Secondary | ICD-10-CM | POA: Diagnosis not present

## 2019-09-08 DIAGNOSIS — Z20822 Contact with and (suspected) exposure to covid-19: Secondary | ICD-10-CM

## 2019-09-08 NOTE — Progress Notes (Signed)
Virtual Visit via Telephone Note  I connected with [place] on 09/08/19 at  2:00 PM EDT by telephone and verified that I am speaking with the correct person using two identifiers.   I discussed the limitations, risks, security and privacy concerns of performing an evaluation and management service by telephone and the availability of in person appointments. I also discussed with the patient that there may be a patient responsible charge related to this service. The patient expressed understanding and agreed to proceed.  Location patient: home Location provider: work or home office Participants present for the call: patient, provider Patient did not have a visit in the prior 7 days to address this/these issue(s).   History of Present Illness: 58 year old male who is being evaluated today for an acute issue.  He was notified by his employer that he was in contact with a fellow employee who tested positive for COVID-19.  He was notified last week.  He denies fevers, chills, abdominal pain, shortness of breath, chest pain, loss of taste or smell.  He does endorse diarrhea for the last few days.  His employer is requesting that he get tested for Covid   Observations/Objective: Patient sounds cheerful and well on the phone. I do not appreciate any SOB. Speech and thought processing are grossly intact. Patient reported vitals:  Assessment and Plan: 1. Exposure to COVID-19 virus -We will test for COVID-19.  He was advised to self quarantine until the results come back as should his immediate family.  Can take Imodium or Pepto PRN for diarrhea  - Novel Coronavirus, NAA ([facility]) -Follow-up if symptoms worsen  Follow Up Instructions:  I did not refer this patient for an OV in the next 24 hours for this/these issue(s).  I discussed the assessment and treatment plan with the patient. The patient was provided an opportunity to ask questions and all were answered. The patient agreed with the  plan and demonstrated an understanding of the instructions.   The patient was advised to call back or seek an in-person evaluation if the symptoms worsen or if the condition fails to improve as anticipated.  I provided 15 minutes of non-face-to-face time during this encounter.   Dorothyann Peng, NP

## 2019-09-10 ENCOUNTER — Ambulatory Visit: Payer: BC Managed Care – PPO | Admitting: Adult Health

## 2019-09-10 ENCOUNTER — Telehealth: Payer: Self-pay | Admitting: Adult Health

## 2019-09-10 ENCOUNTER — Encounter: Payer: Self-pay | Admitting: Family Medicine

## 2019-09-10 ENCOUNTER — Telehealth: Payer: Self-pay | Admitting: *Deleted

## 2019-09-10 LAB — NOVEL CORONAVIRUS, NAA: SARS-CoV-2, NAA: NOT DETECTED

## 2019-09-10 NOTE — Telephone Encounter (Signed)
Pt is aware covid 19 test is neg  

## 2019-09-10 NOTE — Telephone Encounter (Signed)
Left a message for a return call.

## 2019-09-10 NOTE — Telephone Encounter (Signed)
Pt was notified to pick up letter at the front desk.  Nothing further needed.

## 2019-09-10 NOTE — Telephone Encounter (Signed)
Copied from [place] [id number]. Topic: General - Other >> Sep 10, 2019  2:27 PM Lennox Solders wrote: Reason for CRM: pt is aware of his covid 31 results negative and would like a letter from [facility] with neg results

## 2019-10-02 ENCOUNTER — Ambulatory Visit: Payer: BC Managed Care – PPO | Admitting: Adult Health

## 2019-10-15 ENCOUNTER — Other Ambulatory Visit: Payer: Self-pay

## 2019-10-16 ENCOUNTER — Encounter: Payer: Self-pay | Admitting: Adult Health

## 2019-10-16 ENCOUNTER — Ambulatory Visit (INDEPENDENT_AMBULATORY_CARE_PROVIDER_SITE_OTHER): Payer: BC Managed Care – PPO | Admitting: Adult Health

## 2019-10-16 VITALS — BP 122/74 | Temp 98.0°F | Wt 201.0 lb

## 2019-10-16 DIAGNOSIS — I10 Essential (primary) hypertension: Secondary | ICD-10-CM | POA: Diagnosis not present

## 2019-10-16 DIAGNOSIS — M25512 Pain in left shoulder: Secondary | ICD-10-CM

## 2019-10-16 DIAGNOSIS — G8929 Other chronic pain: Secondary | ICD-10-CM

## 2019-10-16 DIAGNOSIS — Z23 Encounter for immunization: Secondary | ICD-10-CM | POA: Diagnosis not present

## 2019-10-16 DIAGNOSIS — E1165 Type 2 diabetes mellitus with hyperglycemia: Secondary | ICD-10-CM

## 2019-10-16 LAB — POCT GLYCOSYLATED HEMOGLOBIN (HGB A1C): HbA1c, POC (controlled diabetic range): 7.3 % — AB (ref 0.0–7.0)

## 2019-10-16 MED ORDER — METHYLPREDNISOLONE ACETATE 80 MG/ML IJ SUSP: 80.0000 mg | Freq: Once | INTRAMUSCULAR | Status: DC

## 2019-10-16 MED ORDER — METFORMIN HCL 1000 MG PO TABS: 1000.0000 mg | ORAL_TABLET | Freq: Two times a day (BID) | ORAL | 0 refills | Status: DC

## 2019-10-16 MED ORDER — METHYLPREDNISOLONE ACETATE 80 MG/ML IJ SUSP
80.0000 mg | Freq: Once | INTRAMUSCULAR | Status: AC
  Administered 2019-10-16: 80 mg via INTRA_ARTICULAR

## 2019-10-16 NOTE — Progress Notes (Addendum)
Subjective:    Patient ID: Ronald Pierce, male    DOB: 05/08/1961, 58 y.o.   MRN: [id number]  HPI 58 year old male who is being evaluated today for 32-month follow-up regarding diabetes and hypertension.  During his visit in September Metformin was increased to 1000 mg twice daily and he was continued on glipizide 10 mg XR due to an increase in his A1c from 6.3-8.1.  He was asked to work on lifestyle modifications and follow-up in 3 months.  He does not check his blood sugars at home and refuses to do so.  He denies symptoms of hypoglycemia  Today he reports that he has been working on diet. He has had some GI issues with the increase in metformin, he was taking it at 7 am and 1 pm.  Lab Results  Component Value Date   HGBA1C 7.3 (A) 10/16/2019   As for blood pressure control he is currently on lisinopril 20 mg and hydrochlorothiazide 25 mg.  He has not experienced any lightheadedness, dizziness, chest pain, or shortness of breath.  BP Readings from Last 3 Encounters:  10/16/19 122/74  06/04/19 130/78  12/12/18 120/80    He would also like to have another steroid injection into the left shoulder for chronic arthritic pain. He has had over a year ago and responded well to this. He reports pain with certain movements at work but denies loss of ROM or numbness and tingling down his arm. He has no loss of grip strength.   Review of Systems See HPI   Past Medical History:  Diagnosis Date  . Diabetes mellitus without complication ([place])   . 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 Hyperlipidemia   . Hypertension     Social History   Socioeconomic History  . Marital status: Married    Spouse name: Not on file  . Number of children: Not on file  . Years of education: Not on file  . Highest education level: Not on file  Occupational History  . Not on file  Social Needs  . Financial resource strain: Not on file  . Food insecurity    Worry: Not on file   Inability: Not on file  . Transportation needs    Medical: Not on file    Non-medical: Not on file  Tobacco Use  . Smoking status: Former Smoker    Types: Cigarettes    Quit date: 11/21/2012    Years since quitting: 6.9  . Smokeless tobacco: Never Used  Substance and Sexual Activity  . Alcohol use: Yes    Alcohol/week: 21.0 standard drinks    Types: 21 Shots of liquor per week    Comment: beer on weekends  . Drug use: No  . Sexual activity: Not on file  Lifestyle  . Physical activity    Days per week: Not on file    Minutes per session: Not on file  . Stress: Not on file  Relationships  . Social Herbalist on phone: Not on file    Gets together: Not on file    Attends religious service: Not on file    Active member of club or organization: Not on file    Attends meetings of clubs or organizations: Not on file    Relationship status: Not on file  . Intimate partner violence    Fear of current or ex partner: Not on file    Emotionally abused: Not  on file    Physically abused: Not on file    Forced sexual activity: Not on file  Other Topics Concern  . Not on file  Social History Narrative   High school education    Works at [employer]   Married   One biological child     Past Surgical History:  Procedure Laterality Date  . left forearm tendon repair    . WRIST SURGERY      Family History  Problem Relation Age of Onset  . Diabetes Mother   . Hypertension Mother   . Diabetes Father   . Diabetes Maternal Grandmother   . Hypertension Maternal Grandmother   . Diabetes Maternal Grandfather   . Hypertension Maternal Grandfather   . Colon cancer Neg Hx     No Known Allergies  Current Outpatient Medications on File Prior to Visit  Medication Sig Dispense Refill  . aspirin 81 MG chewable tablet Chew 1 tablet (81 mg total) by mouth daily. 30 tablet 0  . glipiZIDE (GLUCOTROL XL) 10 MG 24 hr tablet TAKE 1 TABLET BY MOUTH ONCE DAILY WITH BREAKFAST 90  tablet 1  . hydrochlorothiazide (HYDRODIURIL) 25 MG tablet Take 1 tablet (25 mg total) by mouth daily. 90 tablet 3  . ibuprofen (ADVIL,MOTRIN) 600 MG tablet Take 1 tablet (600 mg total) by mouth every 6 (six) hours as needed. 90 tablet 1  . lisinopril (ZESTRIL) 20 MG tablet Take 1 tablet by mouth once daily 90 tablet 3  . pravastatin (PRAVACHOL) 20 MG tablet Take 1 tablet by mouth once daily 90 tablet 3   No current facility-administered medications on file prior to visit.     BP 122/74   Temp 98 F (36.7 C)   Wt 201 lb (91.2 kg)   BMI 29.05 kg/m       Objective:   Physical Exam Vitals signs and nursing note reviewed.  Constitutional:      Appearance: Normal appearance.  HENT:     Nose: Nose normal. No congestion or rhinorrhea.     Mouth/Throat:     Mouth: Mucous membranes are moist.     Pharynx: Oropharynx is clear.  Cardiovascular:     Rate and Rhythm: Normal rate and regular rhythm.     Pulses: Normal pulses.     Heart sounds: Normal heart sounds.  Pulmonary:     Effort: Pulmonary effort is normal.     Breath sounds: Normal breath sounds.  Musculoskeletal: Normal range of motion.        General: Tenderness present. No swelling, deformity or signs of injury.     Left shoulder: He exhibits tenderness, bony tenderness and pain. He exhibits normal range of motion, no swelling, no deformity, normal pulse and normal strength.     Right lower leg: No edema.     Left lower leg: No edema.  Skin:    General: Skin is warm and dry.     Capillary Refill: Capillary refill takes less than 2 seconds.     Coloration: Skin is not jaundiced or pale.     Findings: No bruising, erythema, lesion or rash.  Neurological:     General: No focal deficit present.     Mental Status: He is alert. Mental status is at baseline.  Psychiatric:        Mood and Affect: Mood normal.        Behavior: Behavior normal.        Thought Content: Thought content normal.  Judgment: Judgment normal.        Assessment & Plan:  1. Uncontrolled type 2 diabetes mellitus with hyperglycemia (HCC) - A1c has improved. Advised to space out dosing to 7 am and 7 pm.  - POC HgB A1c - 7.3  - Follow up in three months  - metFORMIN (GLUCOPHAGE) 1000 MG tablet; Take 1 tablet (1,000 mg total) by mouth 2 (two) times daily with a meal.  Dispense: 180 tablet; Refill: 0  2. Essential hypertension Well controlled.  - No change in medications   3. Chronic left shoulder pain Discussed risks and benefits of corticosteroid injection and patient consented.  After prepping skin with betadine, injected 80 mg depomedrol and 2 cc of plain xylocaine with 22 gauge one and one half inch needle using posterior approach and pt tolerated well.  - methylPREDNISolone acetate (DEPO-MEDROL) injection 80 mg  Dorothyann Peng, NP  This visit occurred during the SARS-CoV-2 public health emergency.  Safety protocols were in place, including screening questions prior to the visit, additional usage of staff PPE, and extensive cleaning of exam room while observing appropriate contact time as indicated for disinfecting solutions.

## 2019-10-16 NOTE — Addendum Note (Signed)
Addended by: Miles Costain T on: 10/16/2019 09:04 AM   Modules accepted: Orders

## 2019-10-16 NOTE — Patient Instructions (Signed)
It was great seeing you today   Your A1c has improved to 7.3.   Try taking the metformin at 7 am and then 7 or 9 pm to prevent stomach issues   Follow up in three months

## 2019-12-14 ENCOUNTER — Other Ambulatory Visit: Payer: Self-pay | Admitting: Adult Health

## 2019-12-14 DIAGNOSIS — E1165 Type 2 diabetes mellitus with hyperglycemia: Secondary | ICD-10-CM

## 2019-12-16 ENCOUNTER — Other Ambulatory Visit: Payer: Self-pay | Admitting: Adult Health

## 2019-12-16 DIAGNOSIS — E1165 Type 2 diabetes mellitus with hyperglycemia: Secondary | ICD-10-CM

## 2019-12-25 ENCOUNTER — Ambulatory Visit: Payer: BC Managed Care – PPO | Attending: Internal Medicine

## 2019-12-25 DIAGNOSIS — Z20822 Contact with and (suspected) exposure to covid-19: Secondary | ICD-10-CM | POA: Diagnosis not present

## 2019-12-26 LAB — NOVEL CORONAVIRUS, NAA: SARS-CoV-2, NAA: DETECTED — AB

## 2019-12-27 ENCOUNTER — Telehealth (HOSPITAL_COMMUNITY): Payer: Self-pay | Admitting: Physician Assistant

## 2019-12-27 NOTE — Telephone Encounter (Signed)
I called patient to discuss infusion treatment.  Left voicemail to call back on hotline [phone].  Leanor Kail, PA - C

## 2019-12-28 ENCOUNTER — Telehealth: Payer: Self-pay | Admitting: Nurse Practitioner

## 2019-12-28 ENCOUNTER — Other Ambulatory Visit: Payer: Self-pay | Admitting: Nurse Practitioner

## 2019-12-28 DIAGNOSIS — U071 COVID-19: Secondary | ICD-10-CM

## 2019-12-28 DIAGNOSIS — E1165 Type 2 diabetes mellitus with hyperglycemia: Secondary | ICD-10-CM

## 2019-12-28 NOTE — Progress Notes (Signed)
  I connected by phone with Ronald Pierce on 12/28/2019 at 9:45 AM to discuss the potential use of an new treatment for mild to moderate COVID-19 viral infection in non-hospitalized patients.  This patient is a 59 y.o. male that meets the FDA criteria for Emergency Use Authorization of bamlanivimab or casirivimab\imdevimab.  Has a (+) direct SARS-CoV-2 viral test result  Has mild or moderate COVID-19   Is ? 61 years of age and weighs ? 40 kg  Is NOT hospitalized due to COVID-19  Is NOT requiring oxygen therapy or requiring an increase in baseline oxygen flow rate due to COVID-19  Is within 10 days of symptom onset  Has at least one of the high risk factor(s) for progression to severe COVID-19 and/or hospitalization as defined in EUA.  Specific high risk criteria : Diabetes   I have spoken and communicated the following to the patient or parent/caregiver:  1. FDA has authorized the emergency use of bamlanivimab and casirivimab\imdevimab for the treatment of mild to moderate COVID-19 in adults and pediatric patients with positive results of direct SARS-CoV-2 viral testing who are 58 years of age and older weighing at least 40 kg, and who are at high risk for progressing to severe COVID-19 and/or hospitalization.  2. The significant known and potential risks and benefits of bamlanivimab and casirivimab\imdevimab, and the extent to which such potential risks and benefits are unknown.  3. Information on available alternative treatments and the risks and benefits of those alternatives, including clinical trials.  4. Patients treated with bamlanivimab and casirivimab\imdevimab should continue to self-isolate and use infection control measures (e.g., wear mask, isolate, social distance, avoid sharing personal items, clean and disinfect "high touch" surfaces, and frequent handwashing) according to CDC guidelines.   5. The patient or parent/caregiver has the option to accept or refuse  bamlanivimab or casirivimab\imdevimab .  After reviewing this information with the patient, The patient agreed to proceed with receiving the bamlanimivab infusion and will be provided a copy of the Fact sheet prior to receiving the infusion.Fenton Foy 12/28/2019 9:45 AM

## 2019-12-29 ENCOUNTER — Ambulatory Visit (HOSPITAL_COMMUNITY)
Admission: RE | Admit: 2019-12-29 | Discharge: 2019-12-29 | Disposition: A | Discharge status: Home / Self Care | Payer: BC Managed Care – PPO | Source: Ambulatory Visit | Attending: Pulmonary Disease | Admitting: Pulmonary Disease

## 2019-12-29 DIAGNOSIS — E1165 Type 2 diabetes mellitus with hyperglycemia: Secondary | ICD-10-CM | POA: Diagnosis not present

## 2019-12-29 DIAGNOSIS — U071 COVID-19: Secondary | ICD-10-CM

## 2019-12-29 MED ORDER — FAMOTIDINE IN NACL 20-0.9 MG/50ML-% IV SOLN: 20.0000 mg | Freq: Once | INTRAVENOUS | Status: DC | PRN

## 2019-12-29 MED ORDER — METHYLPREDNISOLONE SODIUM SUCC 125 MG IJ SOLR: 125.0000 mg | Freq: Once | INTRAMUSCULAR | Status: DC | PRN

## 2019-12-29 MED ORDER — SODIUM CHLORIDE 0.9 % IV SOLN
700.0000 mg | Freq: Once | INTRAVENOUS | Status: AC
  Administered 2019-12-29: 700 mg via INTRAVENOUS
  Filled 2019-12-29: qty 20

## 2019-12-29 MED ORDER — ALBUTEROL SULFATE HFA 108 (90 BASE) MCG/ACT IN AERS: 2.0000 | INHALATION_SPRAY | Freq: Once | RESPIRATORY_TRACT | Status: DC | PRN

## 2019-12-29 MED ORDER — DIPHENHYDRAMINE HCL 50 MG/ML IJ SOLN: 50.0000 mg | Freq: Once | INTRAMUSCULAR | Status: DC | PRN

## 2019-12-29 MED ORDER — EPINEPHRINE 0.3 MG/0.3ML IJ SOAJ: 0.3000 mg | Freq: Once | INTRAMUSCULAR | Status: DC | PRN

## 2019-12-29 MED ORDER — SODIUM CHLORIDE 0.9 % IV SOLN: INTRAVENOUS | Status: DC | PRN

## 2019-12-29 NOTE — Discharge Instructions (Signed)

## 2019-12-29 NOTE — Progress Notes (Signed)
  Diagnosis: COVID-19  Physician:Dr. Joya Gaskins  Procedure: [facility] Med: bamlanivimab infusion - Provided patient with bamlanimivab fact sheet for patients, parents and caregivers prior to infusion.  Complications: No immediate complications noted.  Discharge: Discharged home   Ronald Pierce 12/29/2019

## 2019-12-31 ENCOUNTER — Telehealth: Payer: Self-pay | Admitting: Adult Health

## 2019-12-31 NOTE — Telephone Encounter (Signed)
Spoke to pt and he stated that he was told to f/u with PCP due to having " very low blood pressure" Pt did not know the number. Tried to schedule pt for VV but declined and stated that he need to go with wife to the respiratory clinic bc she tested positive as well and need to be on a ventilator. Pt stated he will call back in the a.m. and if he can he will schedule a VV tomorrow.   If pt call back please schedule him for a VV

## 2019-12-31 NOTE — Telephone Encounter (Signed)
Tested + COVID 12/26/2019 and was told by his work HR that he needs a note to come back to work. He waid that he was told to quarantine for 7 days or 14 days depending on where he works at.   The patient is just needing more information on what [facility] suggest for him to do as far as going back to work and getting a work note.  Please Advise  [phone]

## 2020-01-01 ENCOUNTER — Encounter: Payer: Self-pay | Admitting: Adult Health

## 2020-01-01 ENCOUNTER — Other Ambulatory Visit: Payer: Self-pay

## 2020-01-01 ENCOUNTER — Telehealth (INDEPENDENT_AMBULATORY_CARE_PROVIDER_SITE_OTHER): Payer: BC Managed Care – PPO | Admitting: Adult Health

## 2020-01-01 DIAGNOSIS — U071 COVID-19: Secondary | ICD-10-CM

## 2020-01-01 NOTE — Progress Notes (Signed)
Virtual Visit via Telephone Note  I connected with Ronald Pierce on 01/01/20 at  2:00 PM EST by telephone and verified that I am speaking with the correct person using two identifiers.   I discussed the limitations, risks, security and privacy concerns of performing an evaluation and management service by telephone and the availability of in person appointments. I also discussed with the patient that there may be a patient responsible charge related to this service. The patient expressed understanding and agreed to proceed.  Location patient: home Location provider: work or home office Participants present for the call: patient, provider Patient did not have a visit in the prior 7 days to address this/these issue(s).   History of Present Illness: 59 year old male who  has a past medical history of Diabetes mellitus without complication ([place]), Drug abuse ([place]), Hyperlipidemia, and Hypertension.   He is being evaluated today for COVID 19 follow up.  He tested positive on January 29 subsequently underwent monoclonal antibody infusion on December 29, 2019.  He reports that after the infusion he felt a little bit better but he continues to be severely fatigued and has intermittent diarrhea.  He is wondering when he can return to work as he is supposed to return on Monday but due to the fatigue does not feel as though he is going to be able to.  He denies nausea, vomiting, fevers, or chills.  He is wondering if he can get another infusion done or if there is anything he can take over-the-counter to help with his symptoms   Observations/Objective: Patient sounds cheerful and well on the phone. I do not appreciate any SOB. Speech and thought processing are grossly intact. Patient reported vitals:  Assessment and Plan: 1. COVID-19 virus detected -He was advised not to return to work and should stay under the 14-day observation.  I will write him a work note to return to work on January 11, 2020.   He was also informed that the monoclonal antibody infusion is a one-time infusion and that there is nothing he can take over-the-counter to help with his symptoms.  He was advised to stay well-hydrated and rest.   Follow Up Instructions:   I did not refer this patient for an OV in the next 24 hours for this/these issue(s).  I discussed the assessment and treatment plan with the patient. The patient was provided an opportunity to ask questions and all were answered. The patient agreed with the plan and demonstrated an understanding of the instructions.   The patient was advised to call back or seek an in-person evaluation if the symptoms worsen or if the condition fails to improve as anticipated.  I provided 22 minutes of non-face-to-face time during this encounter.   Dorothyann Peng, NP

## 2020-01-05 ENCOUNTER — Telehealth: Payer: Self-pay | Admitting: Adult Health

## 2020-01-05 NOTE — Telephone Encounter (Signed)
Pt is trying to r/s his appt on 01/22/20 for an earlier date and stay in office. He stated that he tested pos for COVID and his last quarantine day is 01/10/20.   Is it ok for him to r/s for an earlier date with it being so soon out of quarantine? If not he wants to sch a virtual for an earlier date.  Pt is also needing to know when it will be ok for him to go back to work and when he could pick up the doc's note in order to return?  Pt can be reached at [phone]

## 2020-01-05 NOTE — Telephone Encounter (Signed)
Spoke to Ronald Pierce and he advised that pt is able to come in at a sooner appt. Pt has been schedule for 01/14/2020. No further action needed!

## 2020-01-13 ENCOUNTER — Other Ambulatory Visit: Payer: Self-pay

## 2020-01-14 ENCOUNTER — Ambulatory Visit: Payer: BC Managed Care – PPO | Admitting: Adult Health

## 2020-01-15 ENCOUNTER — Other Ambulatory Visit: Payer: Self-pay | Admitting: Adult Health

## 2020-01-15 DIAGNOSIS — Z760 Encounter for issue of repeat prescription: Secondary | ICD-10-CM

## 2020-01-19 ENCOUNTER — Other Ambulatory Visit: Payer: Self-pay

## 2020-01-20 ENCOUNTER — Ambulatory Visit (INDEPENDENT_AMBULATORY_CARE_PROVIDER_SITE_OTHER): Payer: BC Managed Care – PPO | Admitting: Adult Health

## 2020-01-20 ENCOUNTER — Encounter: Payer: Self-pay | Admitting: Adult Health

## 2020-01-20 VITALS — BP 132/86 | Temp 98.0°F | Wt 191.0 lb

## 2020-01-20 DIAGNOSIS — E1165 Type 2 diabetes mellitus with hyperglycemia: Secondary | ICD-10-CM

## 2020-01-20 DIAGNOSIS — I10 Essential (primary) hypertension: Secondary | ICD-10-CM

## 2020-01-20 LAB — POCT GLYCOSYLATED HEMOGLOBIN (HGB A1C): HbA1c, POC (controlled diabetic range): 7.3 % — AB (ref 0.0–7.0)

## 2020-01-20 NOTE — Patient Instructions (Signed)
It was great seeing you today   Your A1c was 7.3 - please start taking Metformin twice a day   Follow up in 3 months   COVID-19 Vaccine Information can be found at: [email or web address].uk For questions related to vaccine distribution or appointments, please email [email or web address] or call [phone].

## 2020-01-20 NOTE — Progress Notes (Signed)
Subjective:    Patient ID: Ronald Pierce, male    DOB: 11-28-1960, 59 y.o.   MRN: [id number]  HPI 59 year old male who  has a past medical history of Diabetes mellitus without complication ([place]), Drug abuse ([place]), Hyperlipidemia, and Hypertension.  He is being evaluated today for 86-month follow-up regarding diabetes and hypertension.    He is currently prescribed Metformin 1000 mg twice daily and glipizide 10 mg extended release.  He was last seen in November 2020 his A1c had improved from 8.1-7.3.  He was having some GI issues with Metformin as he was taking this medication at 7 AM and 1 PM.  He was advised to take the Metformin every 12 hours.  He reports that he has not been taking Metformin as directed, he has only been taking it in the morning. He has been recovering from COVID 19 infection and continues to be severely fatigued, he often sleeps through the evening dose.   Lab Results  Component Value Date   HGBA1C 7.3 (A) 01/20/2020    Hypertension is controlled on lisinopril 20 mg and hydrochlorothiazide 25 mg.  He does not check his blood pressure at home. He denies dizziness, lightheadedness, chest pain, SOB, or headaches.  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   Hyperlipidemia    Hypertension     Social History   Socioeconomic History   Marital status: Married    Spouse name: Not on file   Number of children: Not on file   Years of education: Not on file   Highest education level: Not on file  Occupational History   Not on file  Tobacco Use   Smoking status: Former Smoker    Types: Cigarettes    Quit date: 11/21/2012    Years since quitting: 7.1   Smokeless tobacco: Never Used  Substance and Sexual Activity   Alcohol  use: Yes    Alcohol/week: 21.0 standard drinks    Types: 21 Shots of liquor per week    Comment: beer on weekends   Drug use: No   Sexual activity: Not on file  Other Topics Concern   Not on file  Social History Narrative   High school education    Works at [employer]   Married   One biological child    Social Determinants of Health   Financial Resource Strain:    Difficulty of Paying Living Expenses: Not on file  Food Insecurity:    Worried About Charity fundraiser in the Last Year: Not on file   [employer] of Food in the Last Year: Not on file  Transportation Needs:    Lack of Transportation (Medical): Not on file   Lack of Transportation (Non-Medical): Not on file  Physical Activity:    Days of Exercise per Week: Not on file   Minutes of Exercise per Session: Not on file  Stress:    Feeling of Stress : Not on file  Social Connections:    Frequency of Communication with Friends and Family: Not on file   Frequency of Social Gatherings with Friends and Family: Not on file   Attends Religious Services: Not on file   Active Member of Clubs  or Organizations: Not on file   Attends Archivist Meetings: Not on file   Marital Status: Not on file  Intimate Partner Violence:    Fear of Current or Ex-Partner: Not on file   Emotionally Abused: Not on file   Physically Abused: Not on file   Sexually Abused: Not on file    Past Surgical History:  Procedure Laterality Date   left forearm tendon repair     WRIST SURGERY      Family History  Problem Relation Age of Onset   Diabetes Mother    Hypertension Mother    Diabetes Father    Diabetes Maternal Grandmother    Hypertension Maternal Grandmother    Diabetes Maternal Grandfather    Hypertension Maternal Grandfather    Colon cancer Neg Hx     No Known Allergies  Current Outpatient Medications on File Prior to Visit  Medication Sig Dispense Refill   aspirin 81 MG chewable  tablet Chew 1 tablet (81 mg total) by mouth daily. 30 tablet 0   glipiZIDE (GLUCOTROL XL) 10 MG 24 hr tablet Take 1 tablet by mouth once daily with breakfast 90 tablet 0   hydrochlorothiazide (HYDRODIURIL) 25 MG tablet Take 1 tablet (25 mg total) by mouth daily. 90 tablet 3   ibuprofen (ADVIL) 600 MG tablet TAKE 1 TABLET BY MOUTH EVERY 6 HOURS AS NEEDED 90 tablet 0   lisinopril (ZESTRIL) 20 MG tablet Take 1 tablet by mouth once daily 90 tablet 3   metFORMIN (GLUCOPHAGE) 1000 MG tablet Take 1 tablet (1,000 mg total) by mouth 2 (two) times daily with a meal. 180 tablet 0   pravastatin (PRAVACHOL) 20 MG tablet Take 1 tablet by mouth once daily 90 tablet 3   No current facility-administered medications on file prior to visit.    BP 132/86    Temp 98 F (36.7 C) (Temporal)    Wt 191 lb (86.6 kg)    BMI 27.60 kg/m       Objective:   Physical Exam Vitals and nursing note reviewed.  Constitutional:      Appearance: Normal appearance.  Cardiovascular:     Rate and Rhythm: Normal rate and regular rhythm.     Pulses: Normal pulses.     Heart sounds: Normal heart sounds.  Pulmonary:     Effort: Pulmonary effort is normal.     Breath sounds: Normal breath sounds.  Musculoskeletal:        General: Normal range of motion.  Skin:    General: Skin is warm and dry.     Capillary Refill: Capillary refill takes less than 2 seconds.  Neurological:     General: No focal deficit present.     Mental Status: He is alert.  Psychiatric:        Mood and Affect: Mood normal.        Behavior: Behavior normal.        Thought Content: Thought content normal.        Judgment: Judgment normal.       Assessment & Plan:  1. Uncontrolled type 2 diabetes mellitus with hyperglycemia (HCC) - POC HgB A1c- 7.3  - Encouraged to start taking Metformin BID  - Follow up in three months   2. Essential hypertension - No change in medications  - Continue to monitor    Dorothyann Peng, NP

## 2020-01-22 ENCOUNTER — Ambulatory Visit: Payer: BC Managed Care – PPO | Admitting: Adult Health

## 2020-02-25 NOTE — Telephone Encounter (Signed)
error 

## 2020-03-08 ENCOUNTER — Other Ambulatory Visit: Payer: Self-pay | Admitting: Adult Health

## 2020-03-08 DIAGNOSIS — E1165 Type 2 diabetes mellitus with hyperglycemia: Secondary | ICD-10-CM

## 2020-03-09 NOTE — Telephone Encounter (Signed)
Sent to the pharmacy by e-scribe. 

## 2020-04-11 ENCOUNTER — Other Ambulatory Visit: Payer: Self-pay | Admitting: Adult Health

## 2020-04-11 DIAGNOSIS — E1165 Type 2 diabetes mellitus with hyperglycemia: Secondary | ICD-10-CM

## 2020-04-13 NOTE — Telephone Encounter (Signed)
APPT ON 04/15/20

## 2020-04-14 ENCOUNTER — Other Ambulatory Visit: Payer: Self-pay

## 2020-04-14 NOTE — Telephone Encounter (Signed)
Pt states he is out of this medication and wondering if it can be refilled or if he has to be seen first at his appt tomorrow?   Pt can be reached at [phone]  Medication Refill: Metformin  Pharmacy: [facility] [address]  [id number]

## 2020-04-14 NOTE — Progress Notes (Signed)
Subjective:    Patient ID: Ronald Pierce, male    DOB: 1960/12/31, 59 y.o.   MRN: [id number]  HPI 59 year old male who  has a past medical history of Diabetes mellitus without complication ([place]), Drug abuse ([place]), Hyperlipidemia, and Hypertension.  He presents to the office today for follow-up regarding diabetes mellitus.  He is currently prescribed Metformin 1000 mg twice daily and glipizide 10 mg daily.  He was last seen in February 2021 at which time his A1c had stayed stable at 7.3.  At this time he was taking Metformin in the morning as he had some GI issues with taking it twice a day but he was also taken at 7 AM and 1 PM.  He was advised to take Metformin every 12 hours.  Lab Results  Component Value Date   HGBA1C 7.3 (A) 01/20/2020   Today he reports that he went back to taking it morning and night and his GI issues resolved.    Review of Systems See HPI   Past Medical History:  Diagnosis Date  . Diabetes mellitus without complication ([place])   . 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 Hyperlipidemia   . Hypertension     Social History   Socioeconomic History  . Marital status: Married    Spouse name: Not on file  . Number of children: Not on file  . Years of education: Not on file  . Highest education level: Not on file  Occupational History  . Not on file  Tobacco Use  . Smoking status: Former Smoker    Types: Cigarettes    Quit date: 11/21/2012    Years since quitting: 7.4  . Smokeless tobacco: Never Used  Substance and Sexual Activity  . Alcohol use: Yes    Alcohol/week: 21.0 standard drinks    Types: 21 Shots of liquor per week    Comment: beer on weekends  . Drug use: No  . Sexual activity: Not on file  Other Topics Concern  . Not on file  Social History Narrative   High school education    Works at [employer]   Married   One biological child    Social Determinants of Health   Financial Resource Strain:   .  Difficulty of Paying Living Expenses:   Food Insecurity:   . Worried About Charity fundraiser in the Last Year:   . Arboriculturist in the Last Year:   Transportation Needs:   . Film/video editor (Medical):   Marland Kitchen Lack of Transportation (Non-Medical):   Physical Activity:   . Days of Exercise per Week:   . Minutes of Exercise per Session:   Stress:   . Feeling of Stress :   Social Connections:   . Frequency of Communication with Friends and Family:   . Frequency of Social Gatherings with Friends and Family:   . Attends Religious Services:   . Active Member of Clubs or Organizations:   . Attends Archivist Meetings:   Marland Kitchen Marital Status:   Intimate Partner Violence:   . Fear of Current or Ex-Partner:   . Emotionally Abused:   Marland Kitchen Physically Abused:   . Sexually Abused:     Past Surgical History:  Procedure Laterality Date  . left forearm tendon repair    . WRIST SURGERY      Family History  Problem Relation Age of Onset  . Diabetes  Mother   . Hypertension Mother   . Diabetes Father   . Diabetes Maternal Grandmother   . Hypertension Maternal Grandmother   . Diabetes Maternal Grandfather   . Hypertension Maternal Grandfather   . Colon cancer Neg Hx     No Known Allergies  Current Outpatient Medications on File Prior to Visit  Medication Sig Dispense Refill  . aspirin 81 MG chewable tablet Chew 1 tablet (81 mg total) by mouth daily. 30 tablet 0  . glipiZIDE (GLUCOTROL XL) 10 MG 24 hr tablet Take 1 tablet by mouth once daily with breakfast 90 tablet 0  . hydrochlorothiazide (HYDRODIURIL) 25 MG tablet Take 1 tablet (25 mg total) by mouth daily. 90 tablet 3  . ibuprofen (ADVIL) 600 MG tablet TAKE 1 TABLET BY MOUTH EVERY 6 HOURS AS NEEDED 90 tablet 0  . lisinopril (ZESTRIL) 20 MG tablet Take 1 tablet by mouth once daily 90 tablet 3  . metFORMIN (GLUCOPHAGE) 1000 MG tablet Take 1 tablet (1,000 mg total) by mouth 2 (two) times daily with a meal. 180 tablet 0  .  pravastatin (PRAVACHOL) 20 MG tablet Take 1 tablet by mouth once daily 90 tablet 3   No current facility-administered medications on file prior to visit.    There were no vitals taken for this visit.      Objective:   Physical Exam Vitals and nursing note reviewed.  Constitutional:      Appearance: Normal appearance.  Cardiovascular:     Rate and Rhythm: Normal rate and regular rhythm.     Pulses: Normal pulses.     Heart sounds: Normal heart sounds.  Pulmonary:     Effort: Pulmonary effort is normal.     Breath sounds: Normal breath sounds.  Musculoskeletal:        General: Normal range of motion.  Skin:    General: Skin is warm and dry.  Neurological:     General: No focal deficit present.     Mental Status: He is alert and oriented to person, place, and time.  Psychiatric:        Mood and Affect: Mood normal.        Behavior: Behavior normal.        Thought Content: Thought content normal.        Judgment: Judgment normal.        Assessment & Plan:  1. Uncontrolled type 2 diabetes mellitus with hyperglycemia (HCC)  - POCT A1C- 7.4 - has increased and not at goal.  - Will increase glipizide from daily to BID dosing - Follow up in 3 months for CPE  - glipiZIDE (GLUCOTROL XL) 10 MG 24 hr tablet; Take 1 tablet (10 mg total) by mouth in the morning and at bedtime.  Dispense: 180 tablet; Refill: 0  Dorothyann Peng, NP

## 2020-04-14 NOTE — Telephone Encounter (Signed)
Sent to the pharmacy by e-scribe. 

## 2020-04-15 ENCOUNTER — Encounter: Payer: Self-pay | Admitting: Adult Health

## 2020-04-15 ENCOUNTER — Ambulatory Visit (INDEPENDENT_AMBULATORY_CARE_PROVIDER_SITE_OTHER): Payer: BC Managed Care – PPO | Admitting: Adult Health

## 2020-04-15 VITALS — BP 130/74 | Temp 98.0°F | Wt 192.0 lb

## 2020-04-15 DIAGNOSIS — E1165 Type 2 diabetes mellitus with hyperglycemia: Secondary | ICD-10-CM

## 2020-04-15 LAB — POCT GLYCOSYLATED HEMOGLOBIN (HGB A1C): HbA1c, POC (controlled diabetic range): 7.4 % — AB (ref 0.0–7.0)

## 2020-04-15 MED ORDER — GLIPIZIDE ER 10 MG PO TB24: 10.0000 mg | ORAL_TABLET | Freq: Two times a day (BID) | ORAL | 0 refills | Status: DC

## 2020-04-15 NOTE — Patient Instructions (Addendum)
It was great seeing you today   Your A1c increased from 7.3 to 7.4 - I am going to increase Glipizide from daily dosing to twice a day dosing.   Please follow up in 3 months for your physical exam

## 2020-05-13 ENCOUNTER — Other Ambulatory Visit: Payer: Self-pay | Admitting: Adult Health

## 2020-05-13 DIAGNOSIS — Z760 Encounter for issue of repeat prescription: Secondary | ICD-10-CM

## 2020-07-01 ENCOUNTER — Encounter: Payer: Self-pay | Admitting: Adult Health

## 2020-07-01 ENCOUNTER — Ambulatory Visit (INDEPENDENT_AMBULATORY_CARE_PROVIDER_SITE_OTHER): Payer: BC Managed Care – PPO | Admitting: Adult Health

## 2020-07-01 ENCOUNTER — Other Ambulatory Visit: Payer: Self-pay

## 2020-07-01 VITALS — BP 124/70 | Temp 98.1°F | Ht 70.5 in | Wt 190.0 lb

## 2020-07-01 DIAGNOSIS — I10 Essential (primary) hypertension: Secondary | ICD-10-CM | POA: Diagnosis not present

## 2020-07-01 DIAGNOSIS — E1165 Type 2 diabetes mellitus with hyperglycemia: Secondary | ICD-10-CM | POA: Diagnosis not present

## 2020-07-01 DIAGNOSIS — Z0000 Encounter for general adult medical examination without abnormal findings: Secondary | ICD-10-CM

## 2020-07-01 DIAGNOSIS — N529 Male erectile dysfunction, unspecified: Secondary | ICD-10-CM

## 2020-07-01 DIAGNOSIS — Z125 Encounter for screening for malignant neoplasm of prostate: Secondary | ICD-10-CM | POA: Diagnosis not present

## 2020-07-01 DIAGNOSIS — E7800 Pure hypercholesterolemia, unspecified: Secondary | ICD-10-CM | POA: Diagnosis not present

## 2020-07-01 MED ORDER — PRAVASTATIN SODIUM 20 MG PO TABS: 20.0000 mg | ORAL_TABLET | Freq: Every day | ORAL | 3 refills | Status: DC

## 2020-07-01 MED ORDER — HYDROCHLOROTHIAZIDE 25 MG PO TABS: 25.0000 mg | ORAL_TABLET | Freq: Every day | ORAL | 3 refills | Status: DC

## 2020-07-01 MED ORDER — TADALAFIL 10 MG PO TABS: 10.0000 mg | ORAL_TABLET | Freq: Every day | ORAL | 3 refills | Status: DC | PRN

## 2020-07-01 NOTE — Progress Notes (Signed)
Subjective:    Patient ID: Ronald Pierce, male    DOB: January 28, 1961, 59 y.o.   MRN: [id number]  HPI Patient presents for yearly preventative medicine examination. He is a pleasant 59 year old male who  has a past medical history of Diabetes mellitus without complication ([place]), Drug abuse ([place]), Hyperlipidemia, and Hypertension.  DM -uncontrolled.  Currently prescribed glipizide 10 mg extended release twice daily and Metformin 1000 mg twice daily.  His last A1c was 7.4. and at this time we increased his glipizide dosing from daily to BID. He reports that he has not been taking either medication twice a day - " I just take it in the morning"  He does not check his blood sugars at home but denies episodes of hypoglycemia Lab Results  Component Value Date   HGBA1C 7.4 (A) 04/15/2020    Hypertension-currently prescribed hydrochlorothiazide 25 mg daily and lisinopril 20 mg daily.  He denies dizziness, lightheadedness, chest pain, shortness of breath, or syncopal episodes  BP Readings from Last 3 Encounters:  07/01/20 124/70  04/15/20 130/74  01/20/20 132/86    Hyperlipidemia-currently prescribed pravastatin 20 mg daily.  He denies myalgia or fatigue.  Also takes a daily baby aspirin  Lab Results  Component Value Date   CHOL 156 06/04/2019   HDL 49.40 06/04/2019   LDLCALC 82 06/04/2019   LDLDIRECT 85.0 09/09/2013   TRIG 123.0 06/04/2019   CHOLHDL 3 06/04/2019   ED- has been trouble getting and maintaining an erection. He tried a friends viagra but this caused him to have a severe headache. He is wondering if there is anything else he can take.   All immunizations and health maintenance protocols were reviewed with the patient and needed orders were placed.  Appropriate screening laboratory values were ordered for the patient including screening of hyperlipidemia, renal function and hepatic function. If indicated by BPH, a PSA was ordered.  Medication reconciliation,  past medical  history, social history, problem list and allergies were reviewed in detail with the patient  Goals were established with regard to weight loss, exercise, and  diet in compliance with medications Wt Readings from Last 3 Encounters:  07/01/20 190 lb (86.2 kg)  04/15/20 192 lb (87.1 kg)  01/20/20 191 lb (86.6 kg)   End of life planning was discussed.  He is up-to-date on routine colon cancer screening. He has not had his diabetic eye exam this year.  He has no acute complaints.    Review of Systems  Constitutional: Negative.   HENT: Negative.   Eyes: Negative.   Respiratory: Negative.   Cardiovascular: Negative.   Gastrointestinal: Negative.   Endocrine: Negative.   Genitourinary: Negative.   Musculoskeletal: Negative.   Skin: Negative.   Allergic/Immunologic: Negative.   Neurological: Negative.   Hematological: Negative.   Psychiatric/Behavioral: Negative.   All other systems reviewed and are negative.  Past Medical History:  Diagnosis Date  . Diabetes mellitus without complication ([place])   . 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 Hyperlipidemia   . Hypertension     Social History   Socioeconomic History  . Marital status: Married    Spouse name: Not on file  . Number of children: Not on file  . Years of education: Not on file  . Highest education level: Not on file  Occupational History  . Not on file  Tobacco Use  . Smoking status: Former Smoker    Types: Cigarettes  Quit date: 11/21/2012    Years since quitting: 7.6  . Smokeless tobacco: Never Used  Substance and Sexual Activity  . Alcohol use: Yes    Alcohol/week: 21.0 standard drinks    Types: 21 Shots of liquor per week    Comment: beer on weekends  . Drug use: No  . Sexual activity: Not on file  Other Topics Concern  . Not on file  Social History Narrative   High school education    Works at [employer]   Married   One biological child    Social Determinants of  Health   Financial Resource Strain:   . Difficulty of Paying Living Expenses:   Food Insecurity:   . Worried About Charity fundraiser in the Last Year:   . Arboriculturist in the Last Year:   Transportation Needs:   . Film/video editor (Medical):   Marland Kitchen Lack of Transportation (Non-Medical):   Physical Activity:   . Days of Exercise per Week:   . Minutes of Exercise per Session:   Stress:   . Feeling of Stress :   Social Connections:   . Frequency of Communication with Friends and Family:   . Frequency of Social Gatherings with Friends and Family:   . Attends Religious Services:   . Active Member of Clubs or Organizations:   . Attends Archivist Meetings:   Marland Kitchen Marital Status:   Intimate Partner Violence:   . Fear of Current or Ex-Partner:   . Emotionally Abused:   Marland Kitchen Physically Abused:   . Sexually Abused:     Past Surgical History:  Procedure Laterality Date  . left forearm tendon repair    . WRIST SURGERY      Family History  Problem Relation Age of Onset  . Diabetes Mother   . Hypertension Mother   . Diabetes Father   . Diabetes Maternal Grandmother   . Hypertension Maternal Grandmother   . Diabetes Maternal Grandfather   . Hypertension Maternal Grandfather   . Colon cancer Neg Hx     No Known Allergies  Current Outpatient Medications on File Prior to Visit  Medication Sig Dispense Refill  . aspirin 81 MG chewable tablet Chew 1 tablet (81 mg total) by mouth daily. 30 tablet 0  . glipiZIDE (GLUCOTROL XL) 10 MG 24 hr tablet Take 1 tablet (10 mg total) by mouth in the morning and at bedtime. 180 tablet 0  . hydrochlorothiazide (HYDRODIURIL) 25 MG tablet Take 1 tablet (25 mg total) by mouth daily. 90 tablet 3  . ibuprofen (ADVIL) 600 MG tablet TAKE 1 TABLET BY MOUTH EVERY 6 HOURS AS NEEDED 90 tablet 1  . lisinopril (ZESTRIL) 20 MG tablet Take 1 tablet by mouth once daily 90 tablet 3  . metFORMIN (GLUCOPHAGE) 1000 MG tablet TAKE 1 TABLET BY MOUTH TWICE  DAILY WITH A MEAL 180 tablet 0  . pravastatin (PRAVACHOL) 20 MG tablet Take 1 tablet by mouth once daily 90 tablet 3   No current facility-administered medications on file prior to visit.    BP 124/70   Temp 98.1 F (36.7 C)   Ht 5' 10.5" (1.791 m)   Wt 190 lb (86.2 kg)   BMI 26.88 kg/m       Objective:   Physical Exam Vitals and nursing note reviewed.  Constitutional:      General: He is not in acute distress.    Appearance: Normal appearance. He is well-developed and overweight.  HENT:  Head: Normocephalic and atraumatic.     Right Ear: Tympanic membrane, ear canal and external ear normal. There is no impacted cerumen.     Left Ear: Tympanic membrane, ear canal and external ear normal. There is no impacted cerumen.     Nose: Nose normal. No congestion or rhinorrhea.     Mouth/Throat:     Mouth: Mucous membranes are moist.     Pharynx: Oropharynx is clear. No oropharyngeal exudate or posterior oropharyngeal erythema.  Eyes:     General:        Right eye: No discharge.        Left eye: No discharge.     Extraocular Movements: Extraocular movements intact.     Conjunctiva/sclera: Conjunctivae normal.     Pupils: Pupils are equal, round, and reactive to light.  Neck:     Vascular: No carotid bruit.     Trachea: No tracheal deviation.  Cardiovascular:     Rate and Rhythm: Normal rate and regular rhythm.     Pulses: Normal pulses.     Heart sounds: Normal heart sounds. No murmur heard.  No friction rub. No gallop.   Pulmonary:     Effort: Pulmonary effort is normal. No respiratory distress.     Breath sounds: Normal breath sounds. No stridor. No wheezing, rhonchi or rales.  Chest:     Chest wall: No tenderness.  Abdominal:     General: Bowel sounds are normal. There is no distension.     Palpations: Abdomen is soft. There is no mass.     Tenderness: There is no abdominal tenderness. There is no right CVA tenderness, left CVA tenderness, guarding or rebound.      Hernia: No hernia is present.  Musculoskeletal:        General: No swelling, tenderness, deformity or signs of injury. Normal range of motion.     Right lower leg: No edema.     Left lower leg: No edema.  Lymphadenopathy:     Cervical: No cervical adenopathy.  Skin:    General: Skin is warm and dry.     Capillary Refill: Capillary refill takes less than 2 seconds.     Coloration: Skin is not jaundiced or pale.     Findings: No bruising, erythema, lesion or rash.  Neurological:     General: No focal deficit present.     Mental Status: He is alert and oriented to person, place, and time.     Cranial Nerves: No cranial nerve deficit.     Sensory: No sensory deficit.     Motor: No weakness.     Coordination: Coordination normal.     Gait: Gait normal.     Deep Tendon Reflexes: Reflexes normal.  Psychiatric:        Mood and Affect: Mood normal.        Behavior: Behavior normal.        Thought Content: Thought content normal.        Judgment: Judgment normal.       Assessment & Plan:  1. Routine general medical examination at a health care facility  - CBC with Differential/Platelet; Future - Hemoglobin A1c; Future - Lipid panel; Future - TSH; Future - CMP with eGFR([facility]); Future  2. Uncontrolled type 2 diabetes mellitus with hyperglycemia ([place]) - Encouraged to take medications as directed BID - Follow up in 3 months or sooner if needed - CBC with Differential/Platelet; Future - Hemoglobin A1c; Future - Lipid panel; Future - TSH; Future -  CMP with eGFR([facility]); Future  3. Essential hypertension - well controlled. No change in medications  - CBC with Differential/Platelet; Future - Hemoglobin A1c; Future - Lipid panel; Future - TSH; Future - CMP with eGFR([facility]); Future - hydrochlorothiazide (HYDRODIURIL) 25 MG tablet; Take 1 tablet (25 mg total) by mouth daily.  Dispense: 90 tablet; Refill: 3  4. Prostate cancer screening  - PSA; Future  5.  HYPERCHOLESTEROLEMIA  - CBC with Differential/Platelet; Future - Hemoglobin A1c; Future - Lipid panel; Future - TSH; Future - CMP with eGFR([facility]); Future - pravastatin (PRAVACHOL) 20 MG tablet; Take 1 tablet (20 mg total) by mouth daily.  Dispense: 90 tablet; Refill: 3  6. Erectile dysfunction, unspecified erectile dysfunction type  - tadalafil (CIALIS) 10 MG tablet; Take 1 tablet (10 mg total) by mouth daily as needed for erectile dysfunction.  Dispense: 10 tablet; Refill: 3   Dorothyann Peng, NP

## 2020-07-02 LAB — COMPLETE METABOLIC PANEL WITH GFR
AG Ratio: 1.2 (calc) (ref 1.0–2.5)
ALT: 17 U/L (ref 9–46)
AST: 15 U/L (ref 10–35)
Albumin: 4.5 g/dL (ref 3.6–5.1)
Alkaline phosphatase (APISO): 44 U/L (ref 35–144)
BUN: 23 mg/dL (ref 7–25)
CO2: 23 mmol/L (ref 20–32)
Calcium: 10.4 mg/dL — ABNORMAL HIGH (ref 8.6–10.3)
Chloride: 102 mmol/L (ref 98–110)
Creat: 1 mg/dL (ref 0.70–1.25)
GFR, Est African American: 94 mL/min/{1.73_m2} (ref >=60)
GFR, Est Non African American: 81 mL/min/{1.73_m2} (ref >=60)
Globulin: 3.8 g/dL (calc) — ABNORMAL HIGH (ref 1.9–3.7)
Glucose, Bld: 141 mg/dL — ABNORMAL HIGH (ref 65–99)
Potassium: 4.8 mmol/L (ref 3.5–5.3)
Sodium: 138 mmol/L (ref 135–146)
Total Bilirubin: 0.2 mg/dL (ref 0.2–1.2)
Total Protein: 8.3 g/dL — ABNORMAL HIGH (ref 6.1–8.1)

## 2020-07-02 LAB — CBC WITH DIFFERENTIAL/PLATELET
Absolute Monocytes: 701 cells/uL (ref 200–950)
Basophils Absolute: 69 cells/uL (ref 0–200)
Basophils Relative: 0.9 %
Eosinophils Absolute: 177 cells/uL (ref 15–500)
Eosinophils Relative: 2.3 %
HCT: 39.8 % (ref 38.5–50.0)
Hemoglobin: 13 g/dL — ABNORMAL LOW (ref 13.2–17.1)
Lymphs Abs: 2195 cells/uL (ref 850–3900)
MCH: 27.1 pg (ref 27.0–33.0)
MCHC: 32.7 g/dL (ref 32.0–36.0)
MCV: 82.9 fL (ref 80.0–100.0)
MPV: 10 fL (ref 7.5–12.5)
Monocytes Relative: 9.1 %
Neutro Abs: 4558 cells/uL (ref 1500–7800)
Neutrophils Relative %: 59.2 %
Platelets: 404 10*3/uL — ABNORMAL HIGH (ref 140–400)
RBC: 4.8 10*6/uL (ref 4.20–5.80)
RDW: 12.6 % (ref 11.0–15.0)
Total Lymphocyte: 28.5 %
WBC: 7.7 10*3/uL (ref 3.8–10.8)

## 2020-07-02 LAB — HEMOGLOBIN A1C
Hgb A1c MFr Bld: 7.6 % of total Hgb — ABNORMAL HIGH (ref <5.7)
Mean Plasma Glucose: 171 (calc)
eAG (mmol/L): 9.5 (calc)

## 2020-07-02 LAB — LIPID PANEL
Cholesterol: 137 mg/dL (ref <200)
HDL: 43 mg/dL (ref >=40)
LDL Cholesterol (Calc): 65 mg/dL (calc)
Non-HDL Cholesterol (Calc): 94 mg/dL (calc) (ref <130)
Total CHOL/HDL Ratio: 3.2 (calc) (ref <5.0)
Triglycerides: 236 mg/dL — ABNORMAL HIGH (ref <150)

## 2020-07-02 LAB — PSA: PSA: 0.5 ng/mL (ref <=4.0)

## 2020-07-02 LAB — TSH: TSH: 2.35 mIU/L (ref 0.40–4.50)

## 2020-08-09 ENCOUNTER — Other Ambulatory Visit: Payer: Self-pay | Admitting: Adult Health

## 2020-08-09 DIAGNOSIS — E1165 Type 2 diabetes mellitus with hyperglycemia: Secondary | ICD-10-CM

## 2020-08-31 ENCOUNTER — Other Ambulatory Visit: Payer: Self-pay | Admitting: Adult Health

## 2020-08-31 DIAGNOSIS — I10 Essential (primary) hypertension: Secondary | ICD-10-CM

## 2020-08-31 DIAGNOSIS — Z760 Encounter for issue of repeat prescription: Secondary | ICD-10-CM

## 2020-09-19 ENCOUNTER — Other Ambulatory Visit: Payer: Self-pay | Admitting: Adult Health

## 2020-09-19 DIAGNOSIS — E1165 Type 2 diabetes mellitus with hyperglycemia: Secondary | ICD-10-CM

## 2020-09-21 ENCOUNTER — Other Ambulatory Visit: Payer: Self-pay | Admitting: Adult Health

## 2020-09-21 DIAGNOSIS — E1165 Type 2 diabetes mellitus with hyperglycemia: Secondary | ICD-10-CM

## 2020-09-30 ENCOUNTER — Encounter: Payer: Self-pay | Admitting: Adult Health

## 2020-09-30 ENCOUNTER — Other Ambulatory Visit: Payer: Self-pay

## 2020-09-30 ENCOUNTER — Ambulatory Visit (INDEPENDENT_AMBULATORY_CARE_PROVIDER_SITE_OTHER): Payer: BC Managed Care – PPO | Admitting: Adult Health

## 2020-09-30 VITALS — BP 122/78 | HR 68 | Temp 98.4°F | Wt 191.4 lb

## 2020-09-30 DIAGNOSIS — E1165 Type 2 diabetes mellitus with hyperglycemia: Secondary | ICD-10-CM

## 2020-09-30 DIAGNOSIS — I10 Essential (primary) hypertension: Secondary | ICD-10-CM | POA: Diagnosis not present

## 2020-09-30 LAB — POCT GLYCOSYLATED HEMOGLOBIN (HGB A1C): Hemoglobin A1C: 6.7 % — AB (ref 4.0–5.6)

## 2020-09-30 NOTE — Progress Notes (Signed)
Subjective:    Patient ID: TEREL BANN, male    DOB: 12-31-1960, 59 y.o.   MRN: [id number]  HPI 59 year old male who  has a past medical history of Diabetes mellitus without complication ([place]), Drug abuse ([place]), Hyperlipidemia, and Hypertension.  He presents to the office today for 58-month follow-up regarding diabetes mellitus and hypertension.  Diabetes-uncontrolled.  Currently prescribed glipizide 10 mg extended release twice daily and Metformin 1000 mg twice daily.  During his last visit he reported that he was not taking either medication twice a day and was just taking them in the morning.  He does not check his blood sugars at home but denies episodes of hypoglycemia.  Was encouraged to take his medications twice daily for better blood sugar control.  He reports that he is taking his medications every morning but is only taking his medications at night about 50% of the time.   Lab Results  Component Value Date   HGBA1C 6.7 (A) 09/30/2020   Essential Hypertension -currently prescribed hydrochlorothiazide 25 mg daily and lisinopril 20 mg daily.  He denies dizziness, lightheadedness, chest pain, shortness of breath, or syncopal episodes. BP Readings from Last 3 Encounters:  09/30/20 122/78  07/01/20 124/70  04/15/20 130/74     Review of Systems See HPI   Past Medical History:  Diagnosis Date  . Diabetes mellitus without complication ([place])   . 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 Hyperlipidemia   . Hypertension     Social History   Socioeconomic History  . Marital status: Married    Spouse name: Not on file  . Number of children: Not on file  . Years of education: Not on file  . Highest education level: Not on file  Occupational History  . Not on file  Tobacco Use  . Smoking status: Former Smoker    Types: Cigarettes    Quit date: 11/21/2012    Years since quitting: 7.8  . Smokeless tobacco: Never Used  Substance and Sexual  Activity  . Alcohol use: Yes    Alcohol/week: 21.0 standard drinks    Types: 21 Shots of liquor per week    Comment: beer on weekends  . Drug use: No  . Sexual activity: Not on file  Other Topics Concern  . Not on file  Social History Narrative   High school education    Works at [employer]   Married   One biological child    Social Determinants of Health   Financial Resource Strain:   . Difficulty of Paying Living Expenses: Not on file  Food Insecurity:   . Worried About Charity fundraiser in the Last Year: Not on file  . Ran Out of Food in the Last Year: Not on file  Transportation Needs:   . Lack of Transportation (Medical): Not on file  . Lack of Transportation (Non-Medical): Not on file  Physical Activity:   . Days of Exercise per Week: Not on file  . Minutes of Exercise per Session: Not on file  Stress:   . Feeling of Stress : Not on file  Social Connections:   . Frequency of Communication with Friends and Family: Not on file  . Frequency of Social Gatherings with Friends and Family: Not on file  . Attends Religious Services: Not on file  . Active Member of Clubs or Organizations: Not on file  . Attends Archivist Meetings: Not  on file  . Marital Status: Not on file  Intimate Partner Violence:   . Fear of Current or Ex-Partner: Not on file  . Emotionally Abused: Not on file  . Physically Abused: Not on file  . Sexually Abused: Not on file    Past Surgical History:  Procedure Laterality Date  . left forearm tendon repair    . WRIST SURGERY      Family History  Problem Relation Age of Onset  . Diabetes Mother   . Hypertension Mother   . Diabetes Father   . Diabetes Maternal Grandmother   . Hypertension Maternal Grandmother   . Diabetes Maternal Grandfather   . Hypertension Maternal Grandfather   . Colon cancer Neg Hx     No Known Allergies  Current Outpatient Medications on File Prior to Visit  Medication Sig Dispense Refill  .  aspirin 81 MG chewable tablet Chew 1 tablet (81 mg total) by mouth daily. 30 tablet 0  . glipiZIDE (GLUCOTROL XL) 10 MG 24 hr tablet TAKE 1 TABLET BY MOUTH IN THE MORNING AND IN THE EVENING 180 tablet 0  . hydrochlorothiazide (HYDRODIURIL) 25 MG tablet Take 1 tablet (25 mg total) by mouth daily. 90 tablet 3  . ibuprofen (ADVIL) 600 MG tablet TAKE 1 TABLET BY MOUTH EVERY 6 HOURS AS NEEDED 90 tablet 1  . lisinopril (ZESTRIL) 20 MG tablet Take 1 tablet (20 mg total) by mouth daily. 90 tablet 1  . metFORMIN (GLUCOPHAGE) 1000 MG tablet TAKE 1 TABLET BY MOUTH TWICE DAILY WITH A MEAL 180 tablet 0  . pravastatin (PRAVACHOL) 20 MG tablet Take 1 tablet (20 mg total) by mouth daily. 90 tablet 3  . tadalafil (CIALIS) 10 MG tablet Take 1 tablet (10 mg total) by mouth daily as needed for erectile dysfunction. 10 tablet 3   No current facility-administered medications on file prior to visit.    BP 122/78 (BP Location: Left Arm, Patient Position: Sitting, Cuff Size: Large)   Pulse 68   Temp 98.4 F (36.9 C) (Oral)   Wt 191 lb 6.4 oz (86.8 kg)   SpO2 98%   BMI 27.07 kg/m       Objective:   Physical Exam Vitals and nursing note reviewed.  Constitutional:      Appearance: Normal appearance.  Cardiovascular:     Rate and Rhythm: Normal rate and regular rhythm.     Pulses: Normal pulses.     Heart sounds: Normal heart sounds.  Pulmonary:     Effort: Pulmonary effort is normal.     Breath sounds: Normal breath sounds.  Musculoskeletal:        General: Normal range of motion.  Skin:    General: Skin is warm and dry.  Neurological:     General: No focal deficit present.     Mental Status: He is alert and oriented to person, place, and time.  Psychiatric:        Mood and Affect: Mood normal.        Thought Content: Thought content normal.        Judgment: Judgment normal.       Assessment & Plan:   1. Uncontrolled type 2 diabetes mellitus with hyperglycemia (HCC)  - POC HgB A1c- 6.7  -  Has stayed stable. Encouraged to take medication twice a day  - Follow up in 6 months   2. Essential hypertension - Well controlled. Continue with current therapy   Dorothyann Peng, NP

## 2021-01-01 ENCOUNTER — Other Ambulatory Visit: Payer: Self-pay | Admitting: Adult Health

## 2021-01-01 DIAGNOSIS — I10 Essential (primary) hypertension: Secondary | ICD-10-CM

## 2021-01-01 DIAGNOSIS — Z760 Encounter for issue of repeat prescription: Secondary | ICD-10-CM

## 2021-01-03 NOTE — Telephone Encounter (Signed)
Denied.  Sent to the pharmacy on 08/31/2020 for 6 months.  Request is too early.

## 2021-01-04 ENCOUNTER — Other Ambulatory Visit: Payer: Self-pay | Admitting: Adult Health

## 2021-01-04 DIAGNOSIS — Z760 Encounter for issue of repeat prescription: Secondary | ICD-10-CM

## 2021-01-04 DIAGNOSIS — I10 Essential (primary) hypertension: Secondary | ICD-10-CM

## 2021-01-05 ENCOUNTER — Telehealth: Payer: Self-pay | Admitting: Adult Health

## 2021-01-05 DIAGNOSIS — I10 Essential (primary) hypertension: Secondary | ICD-10-CM

## 2021-01-05 DIAGNOSIS — Z760 Encounter for issue of repeat prescription: Secondary | ICD-10-CM

## 2021-01-05 MED ORDER — LISINOPRIL 20 MG PO TABS: 20.0000 mg | ORAL_TABLET | Freq: Every day | ORAL | 0 refills | Status: DC

## 2021-01-05 NOTE — Telephone Encounter (Signed)
SENT IN FOR 6 MONTHS ON 08/31/2020 FOR 6 MONTHS.  REQUEST IS TOO EARLY.

## 2021-01-05 NOTE — Telephone Encounter (Signed)
Patient is requesting a refill on his Lisinopril 20 mg.   Pharmacy; [facility] on [address].  Please advise.

## 2021-01-05 NOTE — Telephone Encounter (Signed)
Sent to the pharmacy by e-scribe. 

## 2021-01-19 ENCOUNTER — Other Ambulatory Visit: Payer: Self-pay | Admitting: Adult Health

## 2021-01-19 DIAGNOSIS — N529 Male erectile dysfunction, unspecified: Secondary | ICD-10-CM

## 2021-02-13 DIAGNOSIS — E119 Type 2 diabetes mellitus without complications: Secondary | ICD-10-CM | POA: Diagnosis not present

## 2021-03-07 ENCOUNTER — Other Ambulatory Visit: Payer: Self-pay | Admitting: Adult Health

## 2021-03-07 DIAGNOSIS — E1165 Type 2 diabetes mellitus with hyperglycemia: Secondary | ICD-10-CM

## 2021-03-09 ENCOUNTER — Other Ambulatory Visit: Payer: Self-pay | Admitting: Adult Health

## 2021-03-09 DIAGNOSIS — E1165 Type 2 diabetes mellitus with hyperglycemia: Secondary | ICD-10-CM

## 2021-03-24 ENCOUNTER — Other Ambulatory Visit: Payer: Self-pay | Admitting: Adult Health

## 2021-03-24 DIAGNOSIS — I10 Essential (primary) hypertension: Secondary | ICD-10-CM

## 2021-03-24 DIAGNOSIS — Z760 Encounter for issue of repeat prescription: Secondary | ICD-10-CM

## 2021-03-31 ENCOUNTER — Encounter: Payer: Self-pay | Admitting: Adult Health

## 2021-03-31 ENCOUNTER — Ambulatory Visit (INDEPENDENT_AMBULATORY_CARE_PROVIDER_SITE_OTHER): Payer: BC Managed Care – PPO | Admitting: Adult Health

## 2021-03-31 ENCOUNTER — Other Ambulatory Visit: Payer: Self-pay

## 2021-03-31 VITALS — BP 128/80 | HR 62 | Temp 98.4°F | Ht 70.5 in | Wt 189.2 lb

## 2021-03-31 DIAGNOSIS — E1169 Type 2 diabetes mellitus with other specified complication: Secondary | ICD-10-CM | POA: Diagnosis not present

## 2021-03-31 DIAGNOSIS — N529 Male erectile dysfunction, unspecified: Secondary | ICD-10-CM

## 2021-03-31 DIAGNOSIS — I10 Essential (primary) hypertension: Secondary | ICD-10-CM

## 2021-03-31 LAB — POCT GLYCOSYLATED HEMOGLOBIN (HGB A1C): Hemoglobin A1C: 7.3 % — AB (ref 4.0–5.6)

## 2021-03-31 MED ORDER — METFORMIN HCL 1000 MG PO TABS: 1.0000 | ORAL_TABLET | Freq: Two times a day (BID) | ORAL | 0 refills | Status: DC

## 2021-03-31 MED ORDER — TADALAFIL 20 MG PO TABS: 20.0000 mg | ORAL_TABLET | ORAL | 6 refills | Status: DC | PRN

## 2021-03-31 NOTE — Progress Notes (Signed)
Subjective:    Patient ID: Ronald Pierce, male    DOB: 31-Dec-1960, 60 y.o.   MRN: [id number]  HPI 60 year old male who  has a past medical history of Diabetes mellitus without complication ([place]), Drug abuse ([place]), Hyperlipidemia, and Hypertension.  He presents to the office today for 22-month follow-up regarding diabetes and hypertension  DM - controlled.  Currently prescribed glipizide 10 mg ER twice daily and metformin 1000 mg twice daily.  When he was last seen he reported that he was taking his medications every morning but only taking his medications at night about 50% of the time.  He denied episodes of hypoglycemia. He reports that he has not been taking his medications in the morning because " I was feeling good so I didn't think I needed to take it"   Lab Results  Component Value Date   HGBA1C 6.7 (A) 09/30/2020   HTN -prescribed with HCTZ 25 mg daily and lisinopril 20 mg daily.  He denies dizziness, lightheadedness, chest pain, shortness of breath, or syncopal episodes BP Readings from Last 3 Encounters:  03/31/21 128/80  09/30/20 122/78  07/01/20 124/70   ED - needs a refill of cialis    Review of Systems See HPI   Past Medical History:  Diagnosis Date  . Diabetes mellitus without complication ([place])   . 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 Hyperlipidemia   . Hypertension     Social History   Socioeconomic History  . Marital status: Married    Spouse name: Not on file  . Number of children: Not on file  . Years of education: Not on file  . Highest education level: Not on file  Occupational History  . Not on file  Tobacco Use  . Smoking status: Former Smoker    Types: Cigarettes    Quit date: 11/21/2012    Years since quitting: 8.3  . Smokeless tobacco: Never Used  Substance and Sexual Activity  . Alcohol use: Yes    Alcohol/week: 21.0 standard drinks    Types: 21 Shots of liquor per week    Comment: beer on weekends  . Drug  use: No  . Sexual activity: Not on file  Other Topics Concern  . Not on file  Social History Narrative   High school education    Works at [employer]   Married   One biological child    Social Determinants of Health   Financial Resource Strain: Not on file  Food Insecurity: Not on file  Transportation Needs: Not on file  Physical Activity: Not on file  Stress: Not on file  Social Connections: Not on file  Intimate Partner Violence: Not on file    Past Surgical History:  Procedure Laterality Date  . left forearm tendon repair    . WRIST SURGERY      Family History  Problem Relation Age of Onset  . Diabetes Mother   . Hypertension Mother   . Diabetes Father   . Diabetes Maternal Grandmother   . Hypertension Maternal Grandmother   . Diabetes Maternal Grandfather   . Hypertension Maternal Grandfather   . Colon cancer Neg Hx     No Known Allergies  Current Outpatient Medications on File Prior to Visit  Medication Sig Dispense Refill  . glipiZIDE (GLUCOTROL XL) 10 MG 24 hr tablet TAKE 1 TABLET BY MOUTH IN THE MORNING AND IN THE EVENING 180 tablet 0  .  hydrochlorothiazide (HYDRODIURIL) 25 MG tablet Take 1 tablet (25 mg total) by mouth daily. 90 tablet 3  . ibuprofen (ADVIL) 600 MG tablet TAKE 1 TABLET BY MOUTH EVERY 6 HOURS AS NEEDED 90 tablet 1  . lisinopril (ZESTRIL) 20 MG tablet Take 1 tablet by mouth once daily 90 tablet 0  . metFORMIN (GLUCOPHAGE) 1000 MG tablet TAKE 1 TABLET BY MOUTH TWICE DAILY WITH A MEAL 180 tablet 0  . pravastatin (PRAVACHOL) 20 MG tablet Take 1 tablet (20 mg total) by mouth daily. 90 tablet 3  . tadalafil (CIALIS) 10 MG tablet TAKE 1 TABLET BY MOUTH ONCE DAILY AS NEEDED FOR ERECTILE DYSFUNCTION 10 tablet 3   No current facility-administered medications on file prior to visit.    BP 128/80 (BP Location: Left Arm, Patient Position: Sitting, Cuff Size: Large)   Pulse 62   Temp 98.4 F (36.9 C) (Oral)   Ht 5' 10.5" (1.791 m)   Wt 189  lb 3.2 oz (85.8 kg)   SpO2 96%   BMI 26.76 kg/m       Objective:   Physical Exam Vitals and nursing note reviewed.  Constitutional:      Appearance: Normal appearance.  Musculoskeletal:        General: Normal range of motion.  Skin:    General: Skin is warm and dry.     Capillary Refill: Capillary refill takes less than 2 seconds.  Neurological:     General: No focal deficit present.     Mental Status: He is oriented to person, place, and time.  Psychiatric:        Mood and Affect: Mood normal.        Thought Content: Thought content normal.        Judgment: Judgment normal.       Assessment & Plan:  1. Type 2 diabetes mellitus with other specified complication, without long-term current use of insulin (HCC)  - POC HgB A1c - 7.3 - has increased - Encouraged to get a pill box with AM/PM dispensors so he remembers to take his medications twice daily  - Follow up in 3 months for CPE  - metFORMIN (GLUCOPHAGE) 1000 MG tablet; Take 1 tablet (1,000 mg total) by mouth 2 (two) times daily with a meal.  Dispense: 180 tablet; Refill: 0  2. Essential hypertension - Controlled - No change in medications  3. Erectile dysfunction, unspecified erectile dysfunction type  - tadalafil (CIALIS) 20 MG tablet; Take 1 tablet (20 mg total) by mouth every other day as needed for erectile dysfunction.  Dispense: 30 tablet; Refill: 6  Dorothyann Peng, NP

## 2021-03-31 NOTE — Patient Instructions (Addendum)
It was great seeing you today   Your A1c increased from 6.8 -7.3   Please start taking your diabetes medications twice a day to bring this down   Schedule your physical for after August 6th.

## 2021-04-21 ENCOUNTER — Encounter (HOSPITAL_COMMUNITY): Payer: Self-pay

## 2021-04-21 ENCOUNTER — Other Ambulatory Visit: Payer: Self-pay

## 2021-04-21 ENCOUNTER — Emergency Department (HOSPITAL_COMMUNITY)
Admission: EM | Admit: 2021-04-21 | Discharge: 2021-04-21 | Disposition: A | Discharge status: Home / Self Care | Payer: BC Managed Care – PPO | Attending: Emergency Medicine | Admitting: Emergency Medicine

## 2021-04-21 ENCOUNTER — Emergency Department (HOSPITAL_COMMUNITY): Payer: BC Managed Care – PPO

## 2021-04-21 DIAGNOSIS — S9032XA Contusion of left foot, initial encounter: Secondary | ICD-10-CM | POA: Diagnosis not present

## 2021-04-21 DIAGNOSIS — S2232XA Fracture of one rib, left side, initial encounter for closed fracture: Secondary | ICD-10-CM | POA: Diagnosis not present

## 2021-04-21 DIAGNOSIS — Z87891 Personal history of nicotine dependence: Secondary | ICD-10-CM | POA: Insufficient documentation

## 2021-04-21 DIAGNOSIS — S60222A Contusion of left hand, initial encounter: Secondary | ICD-10-CM

## 2021-04-21 DIAGNOSIS — E119 Type 2 diabetes mellitus without complications: Secondary | ICD-10-CM | POA: Insufficient documentation

## 2021-04-21 DIAGNOSIS — Y92410 Unspecified street and highway as the place of occurrence of the external cause: Secondary | ICD-10-CM | POA: Insufficient documentation

## 2021-04-21 DIAGNOSIS — Z79899 Other long term (current) drug therapy: Secondary | ICD-10-CM | POA: Diagnosis not present

## 2021-04-21 DIAGNOSIS — Z7984 Long term (current) use of oral hypoglycemic drugs: Secondary | ICD-10-CM | POA: Insufficient documentation

## 2021-04-21 DIAGNOSIS — M546 Pain in thoracic spine: Secondary | ICD-10-CM | POA: Diagnosis not present

## 2021-04-21 DIAGNOSIS — I10 Essential (primary) hypertension: Secondary | ICD-10-CM | POA: Insufficient documentation

## 2021-04-21 DIAGNOSIS — S2242XA Multiple fractures of ribs, left side, initial encounter for closed fracture: Secondary | ICD-10-CM | POA: Diagnosis not present

## 2021-04-21 DIAGNOSIS — M545 Low back pain, unspecified: Secondary | ICD-10-CM | POA: Diagnosis not present

## 2021-04-21 DIAGNOSIS — S299XXA Unspecified injury of thorax, initial encounter: Secondary | ICD-10-CM | POA: Diagnosis not present

## 2021-04-21 DIAGNOSIS — M25519 Pain in unspecified shoulder: Secondary | ICD-10-CM | POA: Diagnosis not present

## 2021-04-21 DIAGNOSIS — M79642 Pain in left hand: Secondary | ICD-10-CM | POA: Diagnosis not present

## 2021-04-21 MED ORDER — HYDROCODONE-ACETAMINOPHEN 5-325 MG PO TABS
1.0000 | ORAL_TABLET | Freq: Once | ORAL | Status: AC
  Administered 2021-04-21: 1 via ORAL
  Filled 2021-04-21: qty 1

## 2021-04-21 MED ORDER — HYDROCODONE-ACETAMINOPHEN 5-325 MG PO TABS: 1.0000 | ORAL_TABLET | ORAL | 0 refills | Status: DC | PRN
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
## 2021-04-28 ENCOUNTER — Encounter (HOSPITAL_COMMUNITY): Payer: Self-pay | Admitting: Emergency Medicine

## 2021-04-28 ENCOUNTER — Other Ambulatory Visit: Payer: Self-pay

## 2021-04-28 ENCOUNTER — Ambulatory Visit (HOSPITAL_COMMUNITY)
Admission: EM | Admit: 2021-04-28 | Discharge: 2021-04-28 | Disposition: A | Discharge status: Home / Self Care | Payer: BC Managed Care – PPO

## 2021-04-28 DIAGNOSIS — S2232XD Fracture of one rib, left side, subsequent encounter for fracture with routine healing: Secondary | ICD-10-CM | POA: Diagnosis not present
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
## 2021-04-28 NOTE — Discharge Instructions (Signed)
Continue using ibuprofen, heat, prescribed Norco as needed to manage pain.  It is important that you do the incentive spirometry techniques we discussed.  If you have any worsening symptoms including increased pain, chest pain, shortness of breath you need to be reevaluated immediately.  Follow-up with your primary care as scheduled on Tuesday.
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
## 2021-05-02 ENCOUNTER — Ambulatory Visit (INDEPENDENT_AMBULATORY_CARE_PROVIDER_SITE_OTHER): Payer: BC Managed Care – PPO | Admitting: Adult Health

## 2021-05-02 ENCOUNTER — Encounter: Payer: Self-pay | Admitting: Adult Health

## 2021-05-02 ENCOUNTER — Other Ambulatory Visit: Payer: Self-pay

## 2021-05-02 VITALS — BP 120/70 | HR 85 | Temp 98.2°F | Ht 70.5 in | Wt 188.0 lb

## 2021-05-02 DIAGNOSIS — S2232XD Fracture of one rib, left side, subsequent encounter for fracture with routine healing: Secondary | ICD-10-CM
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
## 2021-05-03 ENCOUNTER — Telehealth: Payer: Self-pay | Admitting: Adult Health

## 2021-05-03 DIAGNOSIS — Z0279 Encounter for issue of other medical certificate: Secondary | ICD-10-CM

## 2021-05-03 NOTE — Telephone Encounter (Signed)
PPW was received

## 2021-05-03 NOTE — Telephone Encounter (Signed)
The patient faxed Disability and Leave forms  Fax to: [phone]  Disposition: Dr's Folder

## 2021-05-05 NOTE — Telephone Encounter (Signed)
Paperwork faxed °

## 2021-05-24 ENCOUNTER — Ambulatory Visit: Payer: BC Managed Care – PPO | Admitting: Adult Health

## 2021-06-29 ENCOUNTER — Other Ambulatory Visit: Payer: Self-pay | Admitting: Adult Health

## 2021-06-29 DIAGNOSIS — I10 Essential (primary) hypertension: Secondary | ICD-10-CM

## 2021-06-29 DIAGNOSIS — Z760 Encounter for issue of repeat prescription: Secondary | ICD-10-CM

## 2021-07-06 ENCOUNTER — Other Ambulatory Visit: Payer: Self-pay

## 2021-07-06 ENCOUNTER — Other Ambulatory Visit: Payer: Self-pay | Admitting: Adult Health

## 2021-07-06 DIAGNOSIS — E7800 Pure hypercholesterolemia, unspecified: Secondary | ICD-10-CM

## 2021-07-07 ENCOUNTER — Encounter: Payer: Self-pay | Admitting: Adult Health

## 2021-07-07 ENCOUNTER — Ambulatory Visit (INDEPENDENT_AMBULATORY_CARE_PROVIDER_SITE_OTHER): Payer: BC Managed Care – PPO | Admitting: Adult Health

## 2021-07-07 VITALS — BP 130/74 | HR 67 | Temp 98.6°F | Ht 69.25 in | Wt 192.0 lb

## 2021-07-07 DIAGNOSIS — E1169 Type 2 diabetes mellitus with other specified complication: Secondary | ICD-10-CM

## 2021-07-07 DIAGNOSIS — Z125 Encounter for screening for malignant neoplasm of prostate: Secondary | ICD-10-CM | POA: Diagnosis not present

## 2021-07-07 DIAGNOSIS — I10 Essential (primary) hypertension: Secondary | ICD-10-CM

## 2021-07-07 DIAGNOSIS — Z0001 Encounter for general adult medical examination with abnormal findings: Secondary | ICD-10-CM

## 2021-07-07 DIAGNOSIS — E7800 Pure hypercholesterolemia, unspecified: Secondary | ICD-10-CM | POA: Diagnosis not present

## 2021-07-07 LAB — CBC WITH DIFFERENTIAL/PLATELET
Basophils Absolute: 0.1 10*3/uL (ref 0.0–0.1)
Basophils Relative: 1 % (ref 0.0–3.0)
Eosinophils Absolute: 0.1 10*3/uL (ref 0.0–0.7)
Eosinophils Relative: 1.9 % (ref 0.0–5.0)
HCT: 37.5 % — ABNORMAL LOW (ref 39.0–52.0)
Hemoglobin: 12.2 g/dL — ABNORMAL LOW (ref 13.0–17.0)
Lymphocytes Relative: 29.4 % (ref 12.0–46.0)
Lymphs Abs: 1.9 10*3/uL (ref 0.7–4.0)
MCHC: 32.6 g/dL (ref 30.0–36.0)
MCV: 84.4 fl (ref 78.0–100.0)
Monocytes Absolute: 0.7 10*3/uL (ref 0.1–1.0)
Monocytes Relative: 10.3 % (ref 3.0–12.0)
Neutro Abs: 3.7 10*3/uL (ref 1.4–7.7)
Neutrophils Relative %: 57.4 % (ref 43.0–77.0)
Platelets: 405 10*3/uL — ABNORMAL HIGH (ref 150.0–400.0)
RBC: 4.44 Mil/uL (ref 4.22–5.81)
RDW: 13.3 % (ref 11.5–15.5)
WBC: 6.4 10*3/uL (ref 4.0–10.5)

## 2021-07-07 LAB — COMPREHENSIVE METABOLIC PANEL
ALT: 15 U/L (ref 0–53)
AST: 15 U/L (ref 0–37)
Albumin: 4.3 g/dL (ref 3.5–5.2)
Alkaline Phosphatase: 38 U/L — ABNORMAL LOW (ref 39–117)
BUN: 23 mg/dL (ref 6–23)
CO2: 27 mEq/L (ref 19–32)
Calcium: 10 mg/dL (ref 8.4–10.5)
Chloride: 101 mEq/L (ref 96–112)
Creatinine, Ser: 1.21 mg/dL (ref 0.40–1.50)
GFR: 64.27 mL/min (ref >60.00)
Glucose, Bld: 134 mg/dL — ABNORMAL HIGH (ref 70–99)
Potassium: 4.2 mEq/L (ref 3.5–5.1)
Sodium: 136 mEq/L (ref 135–145)
Total Bilirubin: 0.3 mg/dL (ref 0.2–1.2)
Total Protein: 7.9 g/dL (ref 6.0–8.3)

## 2021-07-07 LAB — TSH: TSH: 2.05 u[IU]/mL (ref 0.35–5.50)

## 2021-07-07 LAB — LIPID PANEL
Cholesterol: 137 mg/dL (ref 0–200)
HDL: 40.7 mg/dL (ref >39.00)
LDL Cholesterol: 66 mg/dL (ref 0–99)
NonHDL: 96.66
Total CHOL/HDL Ratio: 3
Triglycerides: 151 mg/dL — ABNORMAL HIGH (ref 0.0–149.0)
VLDL: 30.2 mg/dL (ref 0.0–40.0)

## 2021-07-07 LAB — HEMOGLOBIN A1C: Hgb A1c MFr Bld: 7.6 % — ABNORMAL HIGH (ref 4.6–6.5)

## 2021-07-07 LAB — PSA: PSA: 0.66 ng/mL (ref 0.10–4.00)

## 2021-07-07 MED ORDER — METFORMIN HCL 1000 MG PO TABS: 1000.0000 mg | ORAL_TABLET | Freq: Two times a day (BID) | ORAL | 0 refills | Status: DC

## 2021-07-07 MED ORDER — GLIPIZIDE ER 10 MG PO TB24: ORAL_TABLET | ORAL | 0 refills | Status: DC

## 2021-07-07 NOTE — Progress Notes (Signed)
Subjective:    Patient ID: Ronald Pierce, male    DOB: 01-28-1961, 60 y.o.   MRN: [id number]  HPI Patient presents for yearly preventative medicine examination. He is a pleasant 60 year old male who  has a past medical history of Diabetes mellitus without complication ([place]), Drug abuse ([place]), Hyperlipidemia, and Hypertension.  DM -uncontrolled.  Currently prescribed glipizide 10 mg ER twice daily and metformin 1000 mg twice daily.  In the past he has had difficulty taking medications twice a day ( and continue to forget to take his night time medication most days)   He does not monitor his blood sugars at home.  Denies episodes of hypoglycemia Lab Results  Component Value Date   HGBA1C 7.3 (A) 03/31/2021   HTN -prescribed HCTZ 25 mg daily and lisinopril 20 mg daily.  He denies dizziness, lightheadedness, chest pain, shortness of breath, or syncopal episodes BP Readings from Last 3 Encounters:  07/07/21 130/74  05/02/21 120/70  04/28/21 (!) 155/84   Hyperlipidemia -is currently managed with pravastatin 20 mg daily.  He denies myalgia or fatigue. Lab Results  Component Value Date   CHOL 137 07/01/2020   HDL 43 07/01/2020   LDLCALC 65 07/01/2020   LDLDIRECT 85.0 09/09/2013   TRIG 236 (H) 07/01/2020   CHOLHDL 3.2 07/01/2020    All immunizations and health maintenance protocols were reviewed with the patient and needed orders were placed.  Appropriate screening laboratory values were ordered for the patient including screening of hyperlipidemia, renal function and hepatic function. If indicated by BPH, a PSA was ordered.  Medication reconciliation,  past medical history, social history, problem list and allergies were reviewed in detail with the patient  Goals were established with regard to weight loss, exercise, and  diet in compliance with medications Wt Readings from Last 3 Encounters:  07/07/21 192 lb (87.1 kg)  05/02/21 188 lb (85.3 kg)  03/31/21 189 lb 3.2 oz (85.8 kg)    He is up-to-date on routine colon cancer screening as well as diabetic eye exam.  Review of Systems  Constitutional: Negative.   HENT: Negative.    Eyes: Negative.   Respiratory: Negative.    Cardiovascular: Negative.   Gastrointestinal: Negative.   Endocrine: Negative.   Genitourinary: Negative.   Musculoskeletal:  Positive for arthralgias.  Skin: Negative.   Allergic/Immunologic: Negative.   Neurological: Negative.   Hematological: Negative.   Psychiatric/Behavioral: Negative.    All other systems reviewed and are negative.  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   Hyperlipidemia    Hypertension     Social History   Socioeconomic History   Marital status: Married    Spouse name: Not on file   Number of children: Not on file   Years of education: Not on file   Highest education level: Not on file  Occupational History   Not on file  Tobacco Use   Smoking status: Former    Types: Cigarettes    Quit date: 11/21/2012    Years since quitting: 8.6   Smokeless tobacco: Never  Substance and Sexual Activity   Alcohol use: Yes    Alcohol/week: 21.0 standard drinks    Types: 21 Shots of liquor per week    Comment: beer on weekends   Drug use: No   Sexual activity: Not on file  Other Topics Concern  Not on file  Social History Narrative   High school education    Works at [employer]   Married   One biological child    Social Determinants of Radio broadcast assistant Strain: Not on file  Food Insecurity: Not on file  Transportation Needs: Not on file  Physical Activity: Not on file  Stress: Not on file  Social Connections: Not on file  Intimate Partner Violence: Not on file    Past Surgical History:  Procedure Laterality Date   left forearm tendon repair     WRIST SURGERY      Family History  Problem Relation Age of Onset   Diabetes  Mother    Hypertension Mother    Diabetes Father    Diabetes Maternal Grandmother    Hypertension Maternal Grandmother    Diabetes Maternal Grandfather    Hypertension Maternal Grandfather    Colon cancer Neg Hx     No Known Allergies  Current Outpatient Medications on File Prior to Visit  Medication Sig Dispense Refill   glipiZIDE (GLUCOTROL XL) 10 MG 24 hr tablet TAKE 1 TABLET BY MOUTH IN THE MORNING AND IN THE EVENING 180 tablet 0   hydrochlorothiazide (HYDRODIURIL) 25 MG tablet Take 1 tablet by mouth once daily 90 tablet 0   HYDROcodone-acetaminophen (NORCO/VICODIN) 5-325 MG tablet Take 1 tablet by mouth every 4 (four) hours as needed for severe pain. 10 tablet 0   ibuprofen (ADVIL) 600 MG tablet TAKE 1 TABLET BY MOUTH EVERY 6 HOURS AS NEEDED 90 tablet 1   lisinopril (ZESTRIL) 20 MG tablet Take 1 tablet by mouth once daily 90 tablet 0   metFORMIN (GLUCOPHAGE) 1000 MG tablet Take 1 tablet (1,000 mg total) by mouth 2 (two) times daily with a meal. 180 tablet 0   pravastatin (PRAVACHOL) 20 MG tablet Take 1 tablet by mouth once daily 90 tablet 0   tadalafil (CIALIS) 20 MG tablet Take 1 tablet (20 mg total) by mouth every other day as needed for erectile dysfunction. 30 tablet 6   No current facility-administered medications on file prior to visit.    BP 130/74   Pulse 67   Temp 98.6 F (37 C) (Oral)   Ht 5' 9.25" (1.759 m)   Wt 192 lb (87.1 kg)   SpO2 98%   BMI 28.15 kg/m        Objective:   Physical Exam Vitals and nursing note reviewed.  Constitutional:      General: He is not in acute distress.    Appearance: Normal appearance. He is well-developed and normal weight.  HENT:     Head: Normocephalic and atraumatic.     Right Ear: Tympanic membrane, ear canal and external ear normal. There is no impacted cerumen.     Left Ear: Tympanic membrane, ear canal and external ear normal. There is no impacted cerumen.     Nose: Nose normal. No congestion or rhinorrhea.      Mouth/Throat:     Mouth: Mucous membranes are moist.     Pharynx: Oropharynx is clear. No oropharyngeal exudate or posterior oropharyngeal erythema.  Eyes:     General:        Right eye: No discharge.        Left eye: No discharge.     Extraocular Movements: Extraocular movements intact.     Conjunctiva/sclera: Conjunctivae normal.     Pupils: Pupils are equal, round, and reactive to light.  Neck:     Vascular: No  carotid bruit.     Trachea: No tracheal deviation.  Cardiovascular:     Rate and Rhythm: Normal rate and regular rhythm.     Pulses: Normal pulses.     Heart sounds: Normal heart sounds. No murmur heard.   No friction rub. No gallop.  Pulmonary:     Effort: Pulmonary effort is normal. No respiratory distress.     Breath sounds: Normal breath sounds. No stridor. No wheezing, rhonchi or rales.  Chest:     Chest wall: No tenderness.  Abdominal:     General: Bowel sounds are normal. There is no distension.     Palpations: Abdomen is soft. There is no mass.     Tenderness: There is no abdominal tenderness. There is no right CVA tenderness, left CVA tenderness, guarding or rebound.     Hernia: No hernia is present.  Musculoskeletal:        General: No swelling, tenderness, deformity or signs of injury. Normal range of motion.     Right lower leg: No edema.     Left lower leg: No edema.  Lymphadenopathy:     Cervical: No cervical adenopathy.  Skin:    General: Skin is warm and dry.     Capillary Refill: Capillary refill takes less than 2 seconds.     Coloration: Skin is not jaundiced or pale.     Findings: No bruising, erythema, lesion or rash.  Neurological:     General: No focal deficit present.     Mental Status: He is alert and oriented to person, place, and time.     Cranial Nerves: No cranial nerve deficit.     Sensory: No sensory deficit.     Motor: No weakness.     Coordination: Coordination normal.     Gait: Gait normal.     Deep Tendon Reflexes: Reflexes  normal.  Psychiatric:        Mood and Affect: Mood normal.        Behavior: Behavior normal.        Thought Content: Thought content normal.        Judgment: Judgment normal.      Assessment & Plan:  1. Encounter for general adult medical examination with abnormal findings - Follow up in one year or sooner if needed - CBC with Differential/Platelet; Future - Comprehensive metabolic panel; Future - Hemoglobin A1c; Future - Lipid panel; Future - TSH; Future  2. Type 2 diabetes mellitus with other specified complication, without long-term current use of insulin (HCC) - Follow up in three months  - Encouraged to take medications in the evening nightly. Can get pill box to help  - CBC with Differential/Platelet; Future - Comprehensive metabolic panel; Future - Hemoglobin A1c; Future - Lipid panel; Future - TSH; Future - glipiZIDE (GLUCOTROL XL) 10 MG 24 hr tablet; TAKE 1 TABLET BY MOUTH IN THE MORNING AND IN THE EVENING  Dispense: 180 tablet; Refill: 0 - metFORMIN (GLUCOPHAGE) 1000 MG tablet; Take 1 tablet (1,000 mg total) by mouth 2 (two) times daily with a meal.  Dispense: 180 tablet; Refill: 0  3. Essential hypertension - Controlled. No change in medications  - CBC with Differential/Platelet; Future - Comprehensive metabolic panel; Future - Hemoglobin A1c; Future - Lipid panel; Future - TSH; Future  4. HYPERCHOLESTEROLEMIA - Consider increase in statin  - CBC with Differential/Platelet; Future - Comprehensive metabolic panel; Future - Hemoglobin A1c; Future - Lipid panel; Future - TSH; Future  5. Prostate cancer screening  -  PSA; Future  Dorothyann Peng, NP

## 2021-07-07 NOTE — Addendum Note (Signed)
Addended by: Amanda Cockayne on: 07/07/2021 08:24 AM   Modules accepted: Orders

## 2021-07-07 NOTE — Patient Instructions (Signed)
It was great seeing you today   We will follow up with you regarding your blood work   I will see you back in three months

## 2021-07-18 ENCOUNTER — Telehealth: Payer: Self-pay | Admitting: Adult Health

## 2021-07-18 NOTE — Telephone Encounter (Signed)
The patient is calling for the  lab  results that were done on 07/07/2021; contact number for the  patient is [phone]

## 2021-07-18 NOTE — Telephone Encounter (Signed)
Patient notified of update  and verbalized understanding. 

## 2021-07-18 NOTE — Telephone Encounter (Signed)
PT notified of update and verbalized understanding. Pt stated that does it matter if he takes the Metformin with a meal. Pt  stated he does not know if this is ehy his numbers are up. Pt stated that he barely have time to eat but he makes sure he eats low carbs and sugars.

## 2021-08-02 ENCOUNTER — Other Ambulatory Visit: Payer: Self-pay | Admitting: Adult Health

## 2021-08-02 DIAGNOSIS — Z760 Encounter for issue of repeat prescription: Secondary | ICD-10-CM

## 2021-09-22 ENCOUNTER — Other Ambulatory Visit: Payer: Self-pay | Admitting: Adult Health

## 2021-09-22 DIAGNOSIS — I10 Essential (primary) hypertension: Secondary | ICD-10-CM

## 2021-09-22 DIAGNOSIS — Z760 Encounter for issue of repeat prescription: Secondary | ICD-10-CM

## 2021-09-29 ENCOUNTER — Other Ambulatory Visit: Payer: Self-pay | Admitting: Adult Health

## 2021-09-29 DIAGNOSIS — E1169 Type 2 diabetes mellitus with other specified complication: Secondary | ICD-10-CM

## 2021-09-29 DIAGNOSIS — Z760 Encounter for issue of repeat prescription: Secondary | ICD-10-CM

## 2021-09-29 DIAGNOSIS — E7800 Pure hypercholesterolemia, unspecified: Secondary | ICD-10-CM

## 2021-09-29 DIAGNOSIS — I10 Essential (primary) hypertension: Secondary | ICD-10-CM

## 2021-10-06 ENCOUNTER — Encounter: Payer: Self-pay | Admitting: Adult Health

## 2021-10-06 ENCOUNTER — Ambulatory Visit (INDEPENDENT_AMBULATORY_CARE_PROVIDER_SITE_OTHER): Payer: BC Managed Care – PPO | Admitting: Adult Health

## 2021-10-06 VITALS — BP 120/80 | HR 60 | Temp 98.6°F | Ht 69.25 in | Wt 195.0 lb

## 2021-10-06 DIAGNOSIS — E1169 Type 2 diabetes mellitus with other specified complication: Secondary | ICD-10-CM | POA: Diagnosis not present

## 2021-10-06 DIAGNOSIS — I10 Essential (primary) hypertension: Secondary | ICD-10-CM | POA: Diagnosis not present

## 2021-10-06 DIAGNOSIS — Z23 Encounter for immunization: Secondary | ICD-10-CM

## 2021-10-06 LAB — POCT GLYCOSYLATED HEMOGLOBIN (HGB A1C): Hemoglobin A1C: 7.4 % — AB (ref 4.0–5.6)

## 2021-10-06 NOTE — Progress Notes (Signed)
Subjective:    Patient ID: Ronald Pierce, male    DOB: 05-Aug-1961, 60 y.o.   MRN: [id number]  HPI 60 year old male who  has a past medical history of Diabetes mellitus without complication ([place]), Drug abuse ([place]), Hyperlipidemia, and Hypertension.  He presents to the office today for 6-month follow-up regarding diabetes and hypertension  Diabetes mellitus type 2-uncontrolled.  Currently prescribed glipizide 10 mg ER twice daily and metformin 1000 mg twice daily.  He does not monitor his blood sugars at home but denies episodes of hypoglycemia.  In the past he had had difficulty taking medications twice a day and would often continue to forget to take his nighttime medication most days.  His A1c had increased from 7.3-7.6 back in August 2022.  We decided to hold off on any medication changes at that time.  He was going to work on remembering to take his nighttime medications. Today he reports that he is taking his nighttime medications more frequently.   Lab Results  Component Value Date   HGBA1C 7.4 (A) 10/06/2021   HTN -prescribed HCTZ 25 mg daily and lisinopril 20 mg daily.  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   Hyperlipidemia    Hypertension     Social History   Socioeconomic History   Marital status: Married    Spouse name: Not on file   Number of children: Not on file   Years of education: Not on file   Highest education level: Not on file  Occupational History   Not on file  Tobacco Use   Smoking status: Former    Types: Cigarettes    Quit date: 11/21/2012    Years since quitting: 8.8   Smokeless tobacco: Never  Substance and Sexual Activity    Alcohol use: Yes    Alcohol/week: 21.0 standard drinks    Types: 21 Shots of liquor per week    Comment: beer on weekends   Drug use: No   Sexual activity: Not on file  Other Topics Concern   Not on file  Social History Narrative   High school education    Works at [employer]   Married   One biological child    Social Determinants of Radio broadcast assistant Strain: Not on file  Food Insecurity: Not on file  Transportation Needs: Not on file  Physical Activity: Not on file  Stress: Not on file  Social Connections: Not on file  Intimate Partner Violence: Not on file    Past Surgical History:  Procedure Laterality Date   left forearm tendon repair     WRIST SURGERY      Family History  Problem Relation Age of Onset   Diabetes Mother    Hypertension Mother    Diabetes Father    Diabetes Maternal Grandmother    Hypertension Maternal Grandmother    Diabetes Maternal Grandfather    Hypertension Maternal Grandfather    Colon cancer Neg Hx     No Known Allergies  Current Outpatient Medications on File Prior to Visit  Medication Sig Dispense Refill   glipiZIDE (GLUCOTROL XL) 10 MG 24 hr  tablet TAKE 1 TABLET BY MOUTH IN THE MORNING AND IN THE EVENING 180 tablet 0   hydrochlorothiazide (HYDRODIURIL) 25 MG tablet Take 1 tablet by mouth once daily 90 tablet 0   ibuprofen (ADVIL) 600 MG tablet TAKE 1 TABLET BY MOUTH EVERY 6 HOURS AS NEEDED 90 tablet 0   lisinopril (ZESTRIL) 20 MG tablet Take 1 tablet by mouth once daily 90 tablet 0   metFORMIN (GLUCOPHAGE) 1000 MG tablet TAKE 1 TABLET BY MOUTH TWICE DAILY WITH MEALS 180 tablet 0   pravastatin (PRAVACHOL) 20 MG tablet Take 1 tablet by mouth once daily 90 tablet 0   tadalafil (CIALIS) 20 MG tablet Take 1 tablet (20 mg total) by mouth every other day as needed for erectile dysfunction. 30 tablet 6   No current facility-administered medications on file prior to visit.    BP 120/80   Pulse 60   Temp 98.6 F (37 C)  (Oral)   Ht 5' 9.25" (1.759 m)   Wt 195 lb (88.5 kg)   SpO2 97%   BMI 28.59 kg/m       Objective:   Physical Exam Vitals and nursing note reviewed.  Constitutional:      Appearance: Normal appearance.  Cardiovascular:     Rate and Rhythm: Normal rate and regular rhythm.     Pulses: Normal pulses.     Heart sounds: Normal heart sounds.  Pulmonary:     Effort: Pulmonary effort is normal.     Breath sounds: Normal breath sounds.  Skin:    General: Skin is warm and dry.  Neurological:     General: No focal deficit present.     Mental Status: He is alert and oriented to person, place, and time.  Psychiatric:        Mood and Affect: Mood normal.        Behavior: Behavior normal.        Thought Content: Thought content normal.        Judgment: Judgment normal.      Assessment & Plan:  1. Type 2 diabetes mellitus with other specified complication, without long-term current use of insulin (HCC)  - POC HgB A1c- 7.1  - Improved.  - Will follow up in three months   2. Essential hypertension - Well controlled.  - No change in medications   3. Need for Tdap vaccination  - Tdap vaccine greater than or equal to 7yo IM  4. Need for shingles vaccine  - Varicella-zoster vaccine IM  Dorothyann Peng, NP

## 2021-10-06 NOTE — Patient Instructions (Signed)
It was great seeing you today   Your A1c improved from 7.6 to 7.1   I will have you come back in three months

## 2021-12-25 ENCOUNTER — Other Ambulatory Visit: Payer: Self-pay | Admitting: Adult Health

## 2021-12-25 DIAGNOSIS — E7800 Pure hypercholesterolemia, unspecified: Secondary | ICD-10-CM

## 2021-12-25 DIAGNOSIS — I10 Essential (primary) hypertension: Secondary | ICD-10-CM

## 2021-12-25 DIAGNOSIS — E1169 Type 2 diabetes mellitus with other specified complication: Secondary | ICD-10-CM

## 2022-01-05 ENCOUNTER — Encounter: Payer: Self-pay | Admitting: Adult Health

## 2022-01-05 ENCOUNTER — Ambulatory Visit (INDEPENDENT_AMBULATORY_CARE_PROVIDER_SITE_OTHER): Payer: BC Managed Care – PPO | Admitting: Adult Health

## 2022-01-05 VITALS — BP 130/80 | HR 97 | Temp 98.5°F | Ht 69.25 in | Wt 192.0 lb

## 2022-01-05 DIAGNOSIS — I10 Essential (primary) hypertension: Secondary | ICD-10-CM

## 2022-01-05 DIAGNOSIS — E1169 Type 2 diabetes mellitus with other specified complication: Secondary | ICD-10-CM

## 2022-01-05 LAB — POCT GLYCOSYLATED HEMOGLOBIN (HGB A1C): Hemoglobin A1C: 7.7 % — AB (ref 4.0–5.6)

## 2022-01-05 NOTE — Progress Notes (Signed)
Subjective:    Patient ID: Ronald Pierce, male    DOB: May 29, 1961, 61 y.o.   MRN: [id number]  HPI 61 year old male who  has a past medical history of Diabetes mellitus without complication ([place]), Drug abuse ([place]), Hyperlipidemia, and Hypertension.  He presents to the office today for 109-month follow-up regarding diabetes and hypertension.  Diabetes mellitus type 2/uncontrolled.-Currently prescribed glipizide 10 mg ER twice daily and metformin 1000 mg twice daily.  He does not monitor his blood sugars at home but denies episodes of hypoglycemia.  The past he has had trouble remembering to take his evening medication because he works abnormal hours. His diet has been poor- has been eating a lot of sugars and carbs   Lab Results  Component Value Date   HGBA1C 7.4 (A) 10/06/2021   HTN -managed with HCTZ 25 mg daily and lisinopril 20 mg daily.  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   Hyperlipidemia    Hypertension     Social History   Socioeconomic History   Marital status: Married    Spouse name: Not on file   Number of children: Not on file   Years of education: Not on file   Highest education level: Not on file  Occupational History   Not on file  Tobacco Use   Smoking status: Former    Types: Cigarettes    Quit date: 11/21/2012    Years since quitting: 9.1   Smokeless tobacco: Never  Substance and Sexual Activity   Alcohol use: Yes    Alcohol/week: 21.0 standard drinks    Types: 21 Shots of liquor per week    Comment: beer on weekends   Drug use: No   Sexual activity: Not on file  Other Topics Concern   Not on file  Social History Narrative    High school education    Works at [employer]   Married   One biological child    Social Determinants of Radio broadcast assistant Strain: Not on file  Food Insecurity: Not on file  Transportation Needs: Not on file  Physical Activity: Not on file  Stress: Not on file  Social Connections: Not on file  Intimate Partner Violence: Not on file    Past Surgical History:  Procedure Laterality Date   left forearm tendon repair     WRIST SURGERY      Family History  Problem Relation Age of Onset   Diabetes Mother    Hypertension Mother    Diabetes Father    Diabetes Maternal Grandmother    Hypertension Maternal Grandmother    Diabetes Maternal Grandfather    Hypertension Maternal Grandfather    Colon cancer Neg Hx     No Known Allergies  Current Outpatient Medications on File Prior to Visit  Medication Sig Dispense Refill   glipiZIDE (GLUCOTROL XL) 10 MG 24 hr tablet TAKE 1 TABLET BY MOUTH IN THE MORNING AND IN THE EVENING 180 tablet 0   hydrochlorothiazide (HYDRODIURIL) 25 MG tablet Take 1 tablet by mouth once daily 90 tablet 0   ibuprofen (ADVIL) 600 MG tablet TAKE 1 TABLET BY MOUTH  EVERY 6 HOURS AS NEEDED 90 tablet 0   lisinopril (ZESTRIL) 20 MG tablet Take 1 tablet by mouth once daily 90 tablet 0   metFORMIN (GLUCOPHAGE) 1000 MG tablet TAKE 1 TABLET BY MOUTH TWICE DAILY WITH MEALS 180 tablet 0   pravastatin (PRAVACHOL) 20 MG tablet Take 1 tablet by mouth once daily 90 tablet 0   tadalafil (CIALIS) 20 MG tablet Take 1 tablet (20 mg total) by mouth every other day as needed for erectile dysfunction. 30 tablet 6   No current facility-administered medications on file prior to visit.    BP 130/80    Pulse 97    Temp 98.5 F (36.9 C) (Oral)    Ht 5' 9.25" (1.759 m)    Wt 192 lb (87.1 kg)    SpO2 (!) 76%    BMI 28.15 kg/m       Objective:   Physical Exam Vitals and nursing note reviewed.  Constitutional:      Appearance: Normal appearance.  Cardiovascular:      Rate and Rhythm: Normal rate and regular rhythm.     Pulses: Normal pulses.     Heart sounds: Normal heart sounds.  Pulmonary:     Effort: Pulmonary effort is normal.     Breath sounds: Normal breath sounds.  Musculoskeletal:        General: Normal range of motion.  Skin:    General: Skin is warm and dry.  Neurological:     General: No focal deficit present.     Mental Status: He is alert and oriented to person, place, and time.  Psychiatric:        Mood and Affect: Mood normal.        Behavior: Behavior normal.        Thought Content: Thought content normal.        Judgment: Judgment normal.      Assessment & Plan:  1. Type 2 diabetes mellitus with other specified complication, without long-term current use of insulin (HCC)  - POC HgB A1c- 7.7 - increased and not at goal.  - Does not want to add any additional medication at this time  - Will work on reducing sugars and carbs and taking his evening medications  - Follow up in three months   2. Essential hypertension - Well controlled.  - No change in medications   Dorothyann Peng, NP

## 2022-03-31 ENCOUNTER — Other Ambulatory Visit: Payer: Self-pay | Admitting: Adult Health

## 2022-03-31 DIAGNOSIS — Z760 Encounter for issue of repeat prescription: Secondary | ICD-10-CM

## 2022-03-31 DIAGNOSIS — I10 Essential (primary) hypertension: Secondary | ICD-10-CM

## 2022-04-05 ENCOUNTER — Other Ambulatory Visit: Payer: Self-pay | Admitting: Adult Health

## 2022-04-05 DIAGNOSIS — I10 Essential (primary) hypertension: Secondary | ICD-10-CM

## 2022-04-06 ENCOUNTER — Ambulatory Visit: Payer: BC Managed Care – PPO | Admitting: Adult Health

## 2022-04-07 ENCOUNTER — Other Ambulatory Visit: Payer: Self-pay | Admitting: Adult Health

## 2022-04-07 DIAGNOSIS — I10 Essential (primary) hypertension: Secondary | ICD-10-CM

## 2022-04-09 ENCOUNTER — Other Ambulatory Visit: Payer: Self-pay | Admitting: Adult Health

## 2022-04-09 DIAGNOSIS — E7800 Pure hypercholesterolemia, unspecified: Secondary | ICD-10-CM

## 2022-04-12 DIAGNOSIS — E119 Type 2 diabetes mellitus without complications: Secondary | ICD-10-CM | POA: Diagnosis not present

## 2022-04-12 LAB — HM DIABETES EYE EXAM

## 2022-04-13 ENCOUNTER — Encounter: Payer: Self-pay | Admitting: Adult Health

## 2022-04-13 ENCOUNTER — Ambulatory Visit (INDEPENDENT_AMBULATORY_CARE_PROVIDER_SITE_OTHER): Payer: BC Managed Care – PPO | Admitting: Adult Health

## 2022-04-13 VITALS — BP 118/62 | HR 75 | Temp 98.5°F | Ht 69.5 in | Wt 189.0 lb

## 2022-04-13 DIAGNOSIS — I10 Essential (primary) hypertension: Secondary | ICD-10-CM

## 2022-04-13 DIAGNOSIS — E1169 Type 2 diabetes mellitus with other specified complication: Secondary | ICD-10-CM | POA: Diagnosis not present

## 2022-04-13 DIAGNOSIS — N401 Enlarged prostate with lower urinary tract symptoms: Secondary | ICD-10-CM

## 2022-04-13 DIAGNOSIS — R351 Nocturia: Secondary | ICD-10-CM | POA: Diagnosis not present

## 2022-04-13 LAB — POCT GLYCOSYLATED HEMOGLOBIN (HGB A1C): Hemoglobin A1C: 7.5 % — AB (ref 4.0–5.6)

## 2022-04-13 MED ORDER — METFORMIN HCL 1000 MG PO TABS: 1000.0000 mg | ORAL_TABLET | Freq: Two times a day (BID) | ORAL | 0 refills | Status: DC

## 2022-04-13 MED ORDER — TAMSULOSIN HCL 0.4 MG PO CAPS: 0.4000 mg | ORAL_CAPSULE | Freq: Every day | ORAL | 3 refills | Status: DC

## 2022-04-13 MED ORDER — EMPAGLIFLOZIN 10 MG PO TABS: 10.0000 mg | ORAL_TABLET | Freq: Every day | ORAL | 0 refills | Status: DC

## 2022-04-13 MED ORDER — GLIPIZIDE ER 10 MG PO TB24: ORAL_TABLET | ORAL | 0 refills | Status: DC

## 2022-04-13 NOTE — Progress Notes (Signed)
Subjective:    Patient ID: Ronald Pierce, male    DOB: 08/02/1961, 61 y.o.   MRN: [id number]  HPI 61 year old male who  has a past medical history of Diabetes mellitus without complication ([place]), Drug abuse ([place]), Hyperlipidemia, and Hypertension.  He presents to the office today for 9-monthfollow-up regarding diabetes and hypertension  Diabetes mellitus type 2-currently prescribed glipizide 10 mg extended release daily and metformin 1000 mg twice daily.  He does not monitor his blood sugars at home but denies episodes of hypoglycemia.  During his last visit his A1c had increased from 7.4-7.7.  We discussed starting an additional agent but he wanted to hold off on that for now and instead work on lifestyle modifications. He has not been able to work on lifestyle modifications as much as he hoped. Continues to not exercise nor folow a diet Lab Results  Component Value Date   HGBA1C 7.7 (A) 01/05/2022   Wt Readings from Last 3 Encounters:  04/13/22 189 lb (85.7 kg)  01/05/22 192 lb (87.1 kg)  10/06/21 195 lb (88.5 kg)   Hypertension-is managed with HCTZ 25 mg daily and lisinopril 20 mg daily.  He denies dizziness, lightheadedness, chest pain, shortness of breath, or syncopal episodes BP Readings from Last 3 Encounters:  04/13/22 118/62  01/05/22 130/80  10/06/21 120/80   Additionally, he reports issues with nocturia and decreased stream. He is getting up about 5 times a night to urinate and his stream is not as strong as it was. 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   Hyperlipidemia    Hypertension     Social History   Socioeconomic History   Marital status: Married    Spouse name: Not on file   Number of children: Not on file   Years of education: Not on file   Highest education level: Not on file   Occupational History   Not on file  Tobacco Use   Smoking status: Former    Types: Cigarettes    Quit date: 11/21/2012    Years since quitting: 9.3   Smokeless tobacco: Never  Substance and Sexual Activity   Alcohol use: Yes    Alcohol/week: 21.0 standard drinks    Types: 21 Shots of liquor per week    Comment: beer on weekends   Drug use: No   Sexual activity: Not on file  Other Topics Concern   Not on file  Social History Narrative   High school education    Works at [employer]  Married   One biological child    Social Determinants of Health   Financial Resource Strain: Not on file  Food Insecurity: Not on file  Transportation Needs: Not on file  Physical Activity: Not on file  Stress: Not on file  Social Connections: Not on file  Intimate Partner Violence: Not on file    Past Surgical History:  Procedure Laterality Date   left forearm tendon repair     WRIST SURGERY      Family History  Problem Relation Age of Onset   Diabetes Mother    Hypertension Mother    Diabetes Father    Diabetes Maternal Grandmother    Hypertension Maternal Grandmother    Diabetes Maternal Grandfather  Hypertension Maternal Grandfather    Colon cancer Neg Hx     No Known Allergies  Current Outpatient Medications on File Prior to Visit  Medication Sig Dispense Refill   glipiZIDE (GLUCOTROL XL) 10 MG 24 hr tablet TAKE 1 TABLET BY MOUTH IN THE MORNING AND IN THE EVENING 180 tablet 0   hydrochlorothiazide (HYDRODIURIL) 25 MG tablet Take 1 tablet by mouth once daily 90 tablet 0   ibuprofen (ADVIL) 600 MG tablet TAKE 1 TABLET BY MOUTH EVERY 6 HOURS AS NEEDED 90 tablet 0   lisinopril (ZESTRIL) 20 MG tablet Take 1 tablet by mouth once daily 90 tablet 0   metFORMIN (GLUCOPHAGE) 1000 MG tablet TAKE 1 TABLET BY MOUTH TWICE DAILY WITH MEALS 180 tablet 0   pravastatin (PRAVACHOL) 20 MG tablet Take 1 tablet by mouth once daily 90 tablet 0   tadalafil (CIALIS) 20 MG tablet Take 1  tablet (20 mg total) by mouth every other day as needed for erectile dysfunction. 30 tablet 6   No current facility-administered medications on file prior to visit.    BP 118/62   Pulse 75   Temp 98.5 F (36.9 C) (Oral)   Ht 5' 9.5" (1.765 m)   Wt 189 lb (85.7 kg)   SpO2 97%   BMI 27.51 kg/m       Objective:   Physical Exam Vitals and nursing note reviewed.  Constitutional:      Appearance: Normal appearance.  Cardiovascular:     Rate and Rhythm: Normal rate and regular rhythm.     Heart sounds: Normal heart sounds.  Pulmonary:     Breath sounds: Normal breath sounds.  Musculoskeletal:        General: Normal range of motion.  Skin:    General: Skin is warm and dry.     Capillary Refill: Capillary refill takes less than 2 seconds.  Neurological:     General: No focal deficit present.     Mental Status: He is alert and oriented to person, place, and time.  Psychiatric:        Mood and Affect: Mood normal.        Behavior: Behavior normal.        Thought Content: Thought content normal.      Assessment & Plan:   1. Type 2 diabetes mellitus with other specified complication, without long-term current use of insulin ([place]) - He does not feel as though he could give himself a shot.  - POC HgB A1c- 7.5 improved but not at goal. Will see if Ronald Pierce is covered for him. If it is covered will have him stop HCTZ - empagliflozin (JARDIANCE) 10 MG TABS tablet; Take 1 tablet (10 mg total) by mouth daily before breakfast.  Dispense: 90 tablet; Refill: 0 - metFORMIN (GLUCOPHAGE) 1000 MG tablet; Take 1 tablet (1,000 mg total) by mouth 2 (two) times daily with a meal.  Dispense: 180 tablet; Refill: 0 - glipiZIDE (GLUCOTROL XL) 10 MG 24 hr tablet; TAKE 1 TABLET BY MOUTH IN THE MORNING AND IN THE EVENING  Dispense: 180 tablet; Refill: 0 - Follow up in three months   2. Essential hypertension - Well controlled.   3. Benign prostatic hyperplasia with nocturia - Start on flomax -  tamsulosin (FLOMAX) 0.4 MG CAPS capsule; Take 1 capsule (0.4 mg total) by mouth daily.  Dispense: 90 capsule; Refill: 3   Ronald Peng, NP

## 2022-04-27 ENCOUNTER — Other Ambulatory Visit: Payer: Self-pay | Admitting: Adult Health

## 2022-04-27 DIAGNOSIS — Z760 Encounter for issue of repeat prescription: Secondary | ICD-10-CM

## 2022-06-29 ENCOUNTER — Other Ambulatory Visit: Payer: Self-pay | Admitting: Adult Health

## 2022-06-29 DIAGNOSIS — I10 Essential (primary) hypertension: Secondary | ICD-10-CM

## 2022-07-12 ENCOUNTER — Ambulatory Visit (INDEPENDENT_AMBULATORY_CARE_PROVIDER_SITE_OTHER): Payer: BC Managed Care – PPO | Admitting: Adult Health

## 2022-07-12 ENCOUNTER — Encounter: Payer: Self-pay | Admitting: Adult Health

## 2022-07-12 VITALS — BP 120/68 | HR 78 | Temp 97.9°F | Ht 69.0 in | Wt 190.0 lb

## 2022-07-12 DIAGNOSIS — N529 Male erectile dysfunction, unspecified: Secondary | ICD-10-CM

## 2022-07-12 DIAGNOSIS — E1169 Type 2 diabetes mellitus with other specified complication: Secondary | ICD-10-CM

## 2022-07-12 DIAGNOSIS — E7800 Pure hypercholesterolemia, unspecified: Secondary | ICD-10-CM

## 2022-07-12 DIAGNOSIS — I10 Essential (primary) hypertension: Secondary | ICD-10-CM

## 2022-07-12 LAB — POCT GLYCOSYLATED HEMOGLOBIN (HGB A1C): Hemoglobin A1C: 7.4 % — AB (ref 4.0–5.6)

## 2022-07-12 MED ORDER — LISINOPRIL 20 MG PO TABS: 20.0000 mg | ORAL_TABLET | Freq: Every day | ORAL | 0 refills | Status: DC

## 2022-07-12 MED ORDER — METFORMIN HCL 1000 MG PO TABS: 1000.0000 mg | ORAL_TABLET | Freq: Two times a day (BID) | ORAL | 0 refills | Status: DC

## 2022-07-12 MED ORDER — PRAVASTATIN SODIUM 20 MG PO TABS: 20.0000 mg | ORAL_TABLET | Freq: Every day | ORAL | 0 refills | Status: DC

## 2022-07-12 MED ORDER — GLIPIZIDE ER 10 MG PO TB24: ORAL_TABLET | ORAL | 0 refills | Status: DC

## 2022-07-12 NOTE — Progress Notes (Signed)
Subjective:    Patient ID: Ronald Pierce, male    DOB: 1961/06/11, 61 y.o.   MRN: [id number]  HPI He presents to the office today for follow up regarding diabetes and mellitus  Hypertension-managed with HCTZ 25 mg daily and lisinopril 20 mg daily.  He denies dizziness, lightheadedness, chest pain, shortness of breath, or syncopal episodes BP Readings from Last 3 Encounters:  07/12/22 120/68  04/13/22 118/62  01/05/22 130/80   Diabetes mellitus type 2-managed with glipizide 10 mg ER twice daily and metformin 1000 mg twice daily. He was prescribed Jardiance 10 mg during the last visit but stopped taking it due to "it made me feel bad"  He does not monitor his blood sugars at home.  He denies episodes of hypoglycemia. 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   Hyperlipidemia    Hypertension     Social History   Socioeconomic History   Marital status: Married    Spouse name: Not on file   Number of children: Not on file   Years of education: Not on file   Highest education level: Not on file  Occupational History   Not on file  Tobacco Use   Smoking status: Former    Types: Cigarettes    Quit date: 11/21/2012    Years since quitting: 9.6   Smokeless tobacco: Never  Substance and Sexual Activity   Alcohol use: Yes    Alcohol/week: 21.0 standard drinks of alcohol    Types: 21 Shots of liquor per week    Comment: beer on weekends   Drug use: No   Sexual activity: Not on file  Other Topics Concern   Not on file  Social History Narrative   High school education    Works at [employer]   Married   One biological child    Social Determinants of Radio broadcast assistant Strain: Not on file  Food Insecurity: Not on  file  Transportation Needs: Not on file  Physical Activity: Not on file  Stress: Not on file  Social Connections: Not on file  Intimate Partner Violence: Not on file    Past Surgical History:  Procedure Laterality Date   left forearm tendon repair     WRIST SURGERY      Family History  Problem Relation Age of Onset   Diabetes Mother    Hypertension Mother    Diabetes Father    Diabetes Maternal Grandmother    Hypertension Maternal Grandmother    Diabetes Maternal Grandfather    Hypertension Maternal Grandfather    Colon cancer Neg Hx     No Known Allergies  Current Outpatient Medications on File Prior to Visit  Medication Sig Dispense Refill   hydrochlorothiazide (HYDRODIURIL) 25 MG tablet Take 1 tablet by mouth once daily 90 tablet 0   ibuprofen (ADVIL) 600 MG tablet TAKE 1 TABLET BY MOUTH EVERY 6 HOURS AS NEEDED 90 tablet 0   tadalafil (CIALIS) 20 MG tablet Take 1 tablet (20 mg total) by mouth every other day as needed for erectile dysfunction. 30 tablet 6   No current facility-administered medications on file prior to visit.  BP 120/68   Pulse 78   Temp 97.9 F (36.6 C) (Oral)   Ht '5\' 9"'$  (1.753 m)   Wt 190 lb (86.2 kg)   SpO2 98%   BMI 28.06 kg/m       Objective:   Physical Exam Vitals and nursing note reviewed.  Constitutional:      Appearance: Normal appearance.  Cardiovascular:     Rate and Rhythm: Normal rate and regular rhythm.     Pulses: Normal pulses.     Heart sounds: Normal heart sounds.  Pulmonary:     Effort: Pulmonary effort is normal.     Breath sounds: Normal breath sounds.  Musculoskeletal:        General: Normal range of motion.  Skin:    General: Skin is warm and dry.     Capillary Refill: Capillary refill takes less than 2 seconds.  Neurological:     General: No focal deficit present.     Mental Status: He is alert and oriented to person, place, and time.  Psychiatric:        Mood and Affect: Mood normal.        Behavior:  Behavior normal.        Thought Content: Thought content normal.        Judgment: Judgment normal.       Assessment & Plan:  1. Type 2 diabetes mellitus with other specified complication, without long-term current use of insulin ([facility])  - POC HgB A1c- 7.4  - Start taking glipizide twice a day  - Follow up in 3 months or sooner  - glipiZIDE (GLUCOTROL XL) 10 MG 24 hr tablet; TAKE 1 TABLET BY MOUTH IN THE MORNING AND IN THE EVENING  Dispense: 180 tablet; Refill: 0 - metFORMIN (GLUCOPHAGE) 1000 MG tablet; Take 1 tablet (1,000 mg total) by mouth 2 (two) times daily with a meal.  Dispense: 180 tablet; Refill: 0  2. Essential hypertension  - lisinopril (ZESTRIL) 20 MG tablet; Take 1 tablet (20 mg total) by mouth daily.  Dispense: 90 tablet; Refill: 0  3. HYPERCHOLESTEROLEMIA - needs refill  - pravastatin (PRAVACHOL) 20 MG tablet; Take 1 tablet (20 mg total) by mouth daily.  Dispense: 90 tablet; Refill: 0  Dorothyann Peng, NP

## 2022-07-12 NOTE — Patient Instructions (Addendum)
Your A1c was 7.4 - this improved slightly.   Please follow up in three months for your physical exam   Please take your glipizide twice a day

## 2022-08-02 ENCOUNTER — Other Ambulatory Visit: Payer: Self-pay | Admitting: Adult Health

## 2022-08-02 DIAGNOSIS — Z760 Encounter for issue of repeat prescription: Secondary | ICD-10-CM

## 2022-09-19 ENCOUNTER — Other Ambulatory Visit: Payer: Self-pay | Admitting: Adult Health

## 2022-09-19 DIAGNOSIS — I10 Essential (primary) hypertension: Secondary | ICD-10-CM

## 2022-10-12 ENCOUNTER — Ambulatory Visit (INDEPENDENT_AMBULATORY_CARE_PROVIDER_SITE_OTHER): Payer: BC Managed Care – PPO | Admitting: Adult Health

## 2022-10-12 ENCOUNTER — Telehealth: Payer: Self-pay | Admitting: Adult Health

## 2022-10-12 VITALS — BP 134/96 | HR 80 | Temp 97.6°F | Ht 69.0 in | Wt 193.0 lb

## 2022-10-12 DIAGNOSIS — I10 Essential (primary) hypertension: Secondary | ICD-10-CM

## 2022-10-12 DIAGNOSIS — Z125 Encounter for screening for malignant neoplasm of prostate: Secondary | ICD-10-CM

## 2022-10-12 DIAGNOSIS — E7800 Pure hypercholesterolemia, unspecified: Secondary | ICD-10-CM

## 2022-10-12 DIAGNOSIS — G8929 Other chronic pain: Secondary | ICD-10-CM | POA: Diagnosis not present

## 2022-10-12 DIAGNOSIS — M25511 Pain in right shoulder: Secondary | ICD-10-CM | POA: Diagnosis not present

## 2022-10-12 DIAGNOSIS — Z0001 Encounter for general adult medical examination with abnormal findings: Secondary | ICD-10-CM | POA: Diagnosis not present

## 2022-10-12 DIAGNOSIS — Z0000 Encounter for general adult medical examination without abnormal findings: Secondary | ICD-10-CM

## 2022-10-12 DIAGNOSIS — R051 Acute cough: Secondary | ICD-10-CM

## 2022-10-12 DIAGNOSIS — N529 Male erectile dysfunction, unspecified: Secondary | ICD-10-CM

## 2022-10-12 DIAGNOSIS — E1169 Type 2 diabetes mellitus with other specified complication: Secondary | ICD-10-CM | POA: Diagnosis not present

## 2022-10-12 DIAGNOSIS — J069 Acute upper respiratory infection, unspecified: Secondary | ICD-10-CM | POA: Diagnosis not present

## 2022-10-12 LAB — COMPREHENSIVE METABOLIC PANEL
ALT: 18 U/L (ref 0–53)
AST: 18 U/L (ref 0–37)
Albumin: 4.2 g/dL (ref 3.5–5.2)
Alkaline Phosphatase: 40 U/L (ref 39–117)
BUN: 23 mg/dL (ref 6–23)
CO2: 29 mEq/L (ref 19–32)
Calcium: 9.5 mg/dL (ref 8.4–10.5)
Chloride: 99 mEq/L (ref 96–112)
Creatinine, Ser: 1.2 mg/dL (ref 0.40–1.50)
GFR: 64.34 mL/min (ref >60.00)
Glucose, Bld: 124 mg/dL — ABNORMAL HIGH (ref 70–99)
Potassium: 4.8 mEq/L (ref 3.5–5.1)
Sodium: 136 mEq/L (ref 135–145)
Total Bilirubin: 0.3 mg/dL (ref 0.2–1.2)
Total Protein: 7.7 g/dL (ref 6.0–8.3)

## 2022-10-12 LAB — CBC WITH DIFFERENTIAL/PLATELET
Basophils Absolute: 0.1 10*3/uL (ref 0.0–0.1)
Basophils Relative: 0.9 % (ref 0.0–3.0)
Eosinophils Absolute: 0.2 10*3/uL (ref 0.0–0.7)
Eosinophils Relative: 2.1 % (ref 0.0–5.0)
HCT: 37.9 % — ABNORMAL LOW (ref 39.0–52.0)
Hemoglobin: 12.4 g/dL — ABNORMAL LOW (ref 13.0–17.0)
Lymphocytes Relative: 23.4 % (ref 12.0–46.0)
Lymphs Abs: 2 10*3/uL (ref 0.7–4.0)
MCHC: 32.6 g/dL (ref 30.0–36.0)
MCV: 84.6 fl (ref 78.0–100.0)
Monocytes Absolute: 0.9 10*3/uL (ref 0.1–1.0)
Monocytes Relative: 11 % (ref 3.0–12.0)
Neutro Abs: 5.3 10*3/uL (ref 1.4–7.7)
Neutrophils Relative %: 62.6 % (ref 43.0–77.0)
Platelets: 394 10*3/uL (ref 150.0–400.0)
RBC: 4.48 Mil/uL (ref 4.22–5.81)
RDW: 13.8 % (ref 11.5–15.5)
WBC: 8.4 10*3/uL (ref 4.0–10.5)

## 2022-10-12 LAB — LIPID PANEL
Cholesterol: 143 mg/dL (ref 0–200)
HDL: 47.3 mg/dL (ref >39.00)
LDL Cholesterol: 72 mg/dL (ref 0–99)
NonHDL: 96.1
Total CHOL/HDL Ratio: 3
Triglycerides: 121 mg/dL (ref 0.0–149.0)
VLDL: 24.2 mg/dL (ref 0.0–40.0)

## 2022-10-12 LAB — TSH: TSH: 1.76 u[IU]/mL (ref 0.35–5.50)

## 2022-10-12 LAB — POC COVID19 BINAXNOW: SARS Coronavirus 2 Ag: NEGATIVE

## 2022-10-12 LAB — HEMOGLOBIN A1C: Hgb A1c MFr Bld: 8 % — ABNORMAL HIGH (ref 4.6–6.5)

## 2022-10-12 LAB — PSA: PSA: 0.81 ng/mL (ref 0.10–4.00)

## 2022-10-12 MED ORDER — TADALAFIL 20 MG PO TABS: 20.0000 mg | ORAL_TABLET | ORAL | 6 refills | Status: DC | PRN

## 2022-10-12 MED ORDER — PRAVASTATIN SODIUM 20 MG PO TABS: 20.0000 mg | ORAL_TABLET | Freq: Every day | ORAL | 3 refills | Status: DC

## 2022-10-12 MED ORDER — METHYLPREDNISOLONE ACETATE 80 MG/ML IJ SUSP
80.0000 mg | Freq: Once | INTRAMUSCULAR | Status: AC
  Administered 2022-10-12: 80 mg via INTRA_ARTICULAR

## 2022-10-12 MED ORDER — METFORMIN HCL 1000 MG PO TABS: 1000.0000 mg | ORAL_TABLET | Freq: Two times a day (BID) | ORAL | 0 refills | Status: DC

## 2022-10-12 MED ORDER — LISINOPRIL 30 MG PO TABS: 30.0000 mg | ORAL_TABLET | Freq: Every day | ORAL | 3 refills | Status: DC

## 2022-10-12 MED ORDER — HYDROCHLOROTHIAZIDE 25 MG PO TABS: 25.0000 mg | ORAL_TABLET | Freq: Every day | ORAL | 3 refills | Status: DC

## 2022-10-12 NOTE — Progress Notes (Signed)
Subjective:    Patient ID: Ronald Pierce, male    DOB: [birth year], 61 y.o.   MRN: [id number]  HPI  Patient presents for yearly preventative medicine examination. He is a pleasant 61 year old male who  has a past medical history of Diabetes mellitus without complication ([place]), Drug abuse ([place]), Hyperlipidemia, and Hypertension.  DM Type 2 -managed with glipizide 10 mg ER twice daily and metformin 1000 mg twice daily.  He does not monitor his blood sugars at home.  He denies episodes of hypoglycemia.  In the past we have had trouble with medication compliance with him only taking his glipizide and metformin once a day.Today he reports that he is taking his Metformin twice a day but " forgot" that he was supposed to take his glipizide twice a day  Lab Results  Component Value Date   HGBA1C 7.4 (A) 07/12/2022   HTN -managed with HCTZ 25 mg daily and lisinopril 20 mg daily.  He denies dizziness, lightheadedness, chest pain, shortness of breath, or syncopal episodes. He does report that he had some biometric screening done back in August and his BP was elevated then at 134/96  BP Readings from Last 3 Encounters:  10/12/22 (!) 134/96  07/12/22 120/68  04/13/22 118/62   Hyperlipidemia -currently managed with pravastatin 20 mg daily.  He denies myalgia or fatigue  Lab Results  Component Value Date   CHOL 137 07/07/2021   HDL 40.70 07/07/2021   LDLCALC 66 07/07/2021   LDLDIRECT 85.0 09/09/2013   TRIG 151.0 (H) 07/07/2021   CHOLHDL 3 07/07/2021   [place]   He returned from [place] about 5 days ago and since he has had a non productive cough and " sniffles" such he returned. He did test at home for Covid when he returned and it was negative.   He also reports worsening right shoulder pain for the last few months. He feels as though he has loss of ROM. 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 Denies trauma or injury but does do repetitive motions at work. He has responded well to steroid  injections in the past and is wondering if he could have one today   All immunizations and health maintenance protocols were reviewed with the patient and needed orders were placed.  Appropriate screening laboratory values were ordered for the patient including screening of hyperlipidemia, renal function and hepatic function.  Medication reconciliation,  past medical history, social history, problem list and allergies were reviewed in detail with the patient  Goals were established with regard to weight loss, exercise, and  diet in compliance with medications  Review of Systems  Constitutional: Negative.   HENT:  Positive for rhinorrhea.   Eyes: Negative.   Respiratory:  Positive for cough.   Cardiovascular: Negative.   Gastrointestinal: Negative.   Endocrine: Negative.   Genitourinary: Negative.   Musculoskeletal: Negative.   Skin: Negative.   Allergic/Immunologic: Negative.   Neurological: Negative.   Hematological: Negative.   Psychiatric/Behavioral: Negative.    All other systems reviewed and are negative.  Past Medical History:  Diagnosis Date   Diabetes mellitus without complication (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   Hyperlipidemia    Hypertension     Social History   Socioeconomic History   Marital status: Married    Spouse name: Not on file   Number of children: Not on file   Years of education: Not on  file   Highest education level: Not on file  Occupational History   Not on file  Tobacco Use   Smoking status: Former    Types: Cigarettes    Quit date: 11/21/2012    Years since quitting: 9.8   Smokeless tobacco: Never  Substance and Sexual Activity   Alcohol use: Yes    Alcohol/week: 21.0 standard drinks of alcohol    Types: 21 Shots of liquor per week    Comment: beer on weekends   Drug use: No   Sexual activity: Not on file  Other Topics Concern   Not on file  Social History Narrative   High school education     Works at [employer]   Married   One biological child    Social Determinants of Radio broadcast assistant Strain: Not on file  Food Insecurity: Not on file  Transportation Needs: Not on file  Physical Activity: Not on file  Stress: Not on file  Social Connections: Not on file  Intimate Partner Violence: Not on file    Past Surgical History:  Procedure Laterality Date   left forearm tendon repair     WRIST SURGERY      Family History  Problem Relation Age of Onset   Diabetes Mother    Hypertension Mother    Diabetes Father    Depression Brother    Diabetes Maternal Grandmother    Hypertension Maternal Grandmother    Diabetes Maternal Grandfather    Hypertension Maternal Grandfather    Colon cancer Neg Hx     No Known Allergies  Current Outpatient Medications on File Prior to Visit  Medication Sig Dispense Refill   glipiZIDE (GLUCOTROL XL) 10 MG 24 hr tablet TAKE 1 TABLET BY MOUTH IN THE MORNING AND IN THE EVENING 180 tablet 0   ibuprofen (ADVIL) 600 MG tablet TAKE 1 TABLET BY MOUTH EVERY 6 HOURS AS NEEDED 90 tablet 0   No current facility-administered medications on file prior to visit.    BP (!) 134/96   Pulse 80   Temp 97.6 F (36.4 C)   Ht '5\' 9"'$  (1.753 m)   Wt 193 lb (87.5 kg)   SpO2 98%   BMI 28.50 kg/m       Objective:   Physical Exam Vitals and nursing note reviewed.  Constitutional:      General: He is not in acute distress.    Appearance: Normal appearance. He is well-developed and normal weight.  HENT:     Head: Normocephalic and atraumatic.     Right Ear: Tympanic membrane, ear canal and external ear normal. There is no impacted cerumen.     Left Ear: Tympanic membrane, ear canal and external ear normal. There is no impacted cerumen.     Nose: Nose normal. No congestion or rhinorrhea.     Mouth/Throat:     Mouth: Mucous membranes are moist.     Pharynx: Oropharynx is clear. No oropharyngeal exudate or posterior oropharyngeal  erythema.  Eyes:     General:        Right eye: No discharge.        Left eye: No discharge.     Extraocular Movements: Extraocular movements intact.     Conjunctiva/sclera: Conjunctivae normal.     Pupils: Pupils are equal, round, and reactive to light.  Neck:     Vascular: No carotid bruit.     Trachea: No tracheal deviation.  Cardiovascular:     Rate and Rhythm: Normal  rate and regular rhythm.     Pulses: Normal pulses.     Heart sounds: Normal heart sounds. No murmur heard.    No friction rub. No gallop.  Pulmonary:     Effort: Pulmonary effort is normal. No respiratory distress.     Breath sounds: Normal breath sounds. No stridor. No wheezing, rhonchi or rales.  Chest:     Chest wall: No tenderness.  Abdominal:     General: Bowel sounds are normal. There is no distension.     Palpations: Abdomen is soft. There is no mass.     Tenderness: There is no abdominal tenderness. There is no right CVA tenderness, left CVA tenderness, guarding or rebound.     Hernia: No hernia is present.  Musculoskeletal:        General: No swelling, tenderness, deformity or signs of injury. Normal range of motion.     Right lower leg: No edema.     Left lower leg: No edema.  Lymphadenopathy:     Cervical: No cervical adenopathy.  Skin:    General: Skin is warm and dry.     Capillary Refill: Capillary refill takes less than 2 seconds.     Coloration: Skin is not jaundiced or pale.     Findings: No bruising, erythema, lesion or rash.  Neurological:     General: No focal deficit present.     Mental Status: He is alert and oriented to person, place, and time.     Cranial Nerves: No cranial nerve deficit.     Sensory: No sensory deficit.     Motor: No weakness.     Coordination: Coordination normal.     Gait: Gait normal.     Deep Tendon Reflexes: Reflexes normal.  Psychiatric:        Mood and Affect: Mood normal.        Behavior: Behavior normal.        Thought Content: Thought content  normal.        Judgment: Judgment normal.       Assessment & Plan:  1. Routine general medical examination at a health care facility - needs to work on weight loss through diet and exercise - Follow up in one year or sooner if needed - CBC with Differential/Platelet; Future - Comprehensive metabolic panel; Future - Hemoglobin A1c; Future - Lipid panel; Future - TSH; Future - CBC with Differential/Platelet - Comprehensive metabolic panel - Hemoglobin A1c - Lipid panel - TSH  2. Type 2 diabetes mellitus with other specified complication, without long-term current use of insulin ([facility]) - Encouraged to take Glipizide twice daily  - Follow up in three months  - CBC with Differential/Platelet; Future - Comprehensive metabolic panel; Future - Hemoglobin A1c; Future - Lipid panel; Future - TSH; Future - Microalbumin/Creatinine Ratio, Urine; Future - CBC with Differential/Platelet - Comprehensive metabolic panel - Hemoglobin A1c - Lipid panel - TSH - Microalbumin/Creatinine Ratio, Urine - metFORMIN (GLUCOPHAGE) 1000 MG tablet; Take 1 tablet (1,000 mg total) by mouth 2 (two) times daily with a meal.  Dispense: 180 tablet; Refill: 0  3. Essential hypertension - elevated today as well. Will increase lisinopril to 30 mg daily.  - CBC with Differential/Platelet; Future - Comprehensive metabolic panel; Future - Hemoglobin A1c; Future - Lipid panel; Future - TSH; Future - Microalbumin/Creatinine Ratio, Urine; Future - CBC with Differential/Platelet - Comprehensive metabolic panel - Hemoglobin A1c - Lipid panel - TSH - Microalbumin/Creatinine Ratio, Urine - hydrochlorothiazide (HYDRODIURIL) 25 MG tablet; Take  1 tablet (25 mg total) by mouth daily.  Dispense: 90 tablet; Refill: 3 - lisinopril (ZESTRIL) 30 MG tablet; Take 1 tablet (30 mg total) by mouth daily.  Dispense: 90 tablet; Refill: 3  4. HYPERCHOLESTEROLEMIA  - CBC with Differential/Platelet; Future - Comprehensive  metabolic panel; Future - Hemoglobin A1c; Future - Lipid panel; Future - TSH; Future - CBC with Differential/Platelet - Comprehensive metabolic panel - Hemoglobin A1c - Lipid panel - TSH - pravastatin (PRAVACHOL) 20 MG tablet; Take 1 tablet (20 mg total) by mouth daily.  Dispense: 90 tablet; Refill: 3  5. Prostate cancer screening  - PSA; Future - PSA  6. Viral upper respiratory tract infection  - POC COVID-19 BinaxNow- negative  - Advised to continue to use OTC medication and stay hydrated - Follow up in 7 days if not resolved   7. Acute cough  - POC COVID-19 BinaxNow  8. Chronic right shoulder pain Shoulder injection Verbal consent obtained and verified. Sterile betadine prep. Furthur cleansed with alcohol. Topical analgesic spray: Ethyl chloride. Joint: right subacromial injection Approached in typical fashion with: posterior approach Completed without difficulty Meds: 3 cc lidocaine 2% no epi, 1 cc depomedrol '80mg'$ /cc Needle:1.5 inch 25 gauge Aftercare instructions and Red flags advised. Immediate improvement in pain noted  - methylPREDNISolone acetate (DEPO-MEDROL) injection 80 mg  9. Erectile dysfunction, unspecified erectile dysfunction type  - tadalafil (CIALIS) 20 MG tablet; Take 1 tablet (20 mg total) by mouth every other day as needed for erectile dysfunction.  Dispense: 30 tablet; Refill: 6  Dorothyann Peng, NP

## 2022-10-12 NOTE — Patient Instructions (Signed)
It was great seeing you today   We will follow up with you regarding your labs   I am going to increase your lisinopril to 30 mg to help bring down your blood pressure  Your covid test was negative. I think you have a viral cold   Follow up in three months

## 2022-10-12 NOTE — Telephone Encounter (Signed)
Updated patient on his labs  

## 2022-10-28 ENCOUNTER — Other Ambulatory Visit: Payer: Self-pay | Admitting: Adult Health

## 2022-10-28 DIAGNOSIS — Z760 Encounter for issue of repeat prescription: Secondary | ICD-10-CM

## 2023-01-15 ENCOUNTER — Telehealth: Payer: Self-pay | Admitting: Adult Health

## 2023-01-15 NOTE — Telephone Encounter (Signed)
Pt called to ask NP if he wanted him to come in for a 3 month FU, after his 10/12/22 CPE?  Pt says he cannot remember.  Please advise.

## 2023-01-15 NOTE — Telephone Encounter (Signed)
Lm on vm advising pt that an appt is needed. Please schedule pt for 3 month follow up.

## 2023-01-17 ENCOUNTER — Ambulatory Visit (INDEPENDENT_AMBULATORY_CARE_PROVIDER_SITE_OTHER): Payer: BC Managed Care – PPO | Admitting: Adult Health

## 2023-01-17 ENCOUNTER — Encounter: Payer: Self-pay | Admitting: Adult Health

## 2023-01-17 VITALS — BP 130/80 | HR 80 | Temp 98.0°F | Ht 69.0 in | Wt 192.0 lb

## 2023-01-17 DIAGNOSIS — I10 Essential (primary) hypertension: Secondary | ICD-10-CM | POA: Diagnosis not present

## 2023-01-17 DIAGNOSIS — E1169 Type 2 diabetes mellitus with other specified complication: Secondary | ICD-10-CM | POA: Diagnosis not present

## 2023-01-17 DIAGNOSIS — Z23 Encounter for immunization: Secondary | ICD-10-CM | POA: Diagnosis not present

## 2023-01-17 LAB — POCT GLYCOSYLATED HEMOGLOBIN (HGB A1C): Hemoglobin A1C: 7.9 % — AB (ref 4.0–5.6)

## 2023-01-17 MED ORDER — METFORMIN HCL 1000 MG PO TABS: 1000.0000 mg | ORAL_TABLET | Freq: Two times a day (BID) | ORAL | 0 refills | Status: DC

## 2023-01-17 MED ORDER — GLIPIZIDE ER 10 MG PO TB24: ORAL_TABLET | ORAL | 0 refills | Status: DC

## 2023-01-17 MED ORDER — DAPAGLIFLOZIN PROPANEDIOL 5 MG PO TABS: 5.0000 mg | ORAL_TABLET | Freq: Every day | ORAL | 0 refills | Status: DC

## 2023-01-17 NOTE — Progress Notes (Signed)
Subjective:    Patient ID: Ronald Pierce, male    DOB: Sep 22, 1961, 62 y.o.   MRN: [id number]  HPI 62 year old male who  has a past medical history of Diabetes mellitus without complication ([place]), Drug abuse ([place]), Hyperlipidemia, and Hypertension.  He presents to the office today for three month follow up   DM Type 2 -managed with glipizide 10 mg ER twice daily and metformin 1000 mg twice daily.  He does not monitor his blood sugars at home.  He denies episodes of hypoglycemia.  In the past we have had trouble with medication compliance with him only taking his glipizide and metformin once a day. Today he reports that he has been remembering to take both of his medications twice a day during the week  but will still forget to take it twice a day during the weekend he will often forget to take his medication twice a day during the weekend. His last A1c was 8.0    HTN -managed with HCTZ 25 mg daily and lisinopril 30 mg daily.  He denies dizziness, lightheadedness, chest pain, shortness of breath, or syncopal episodes. 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   Hyperlipidemia    Hypertension     Social History   Socioeconomic History   Marital status: Married    Spouse name: Not on file   Number of children: Not on file   Years of education: Not on file   Highest education level: Not on file  Occupational History   Not on file  Tobacco Use   Smoking status: Former    Types: Cigarettes    Quit date: 11/21/2012    Years since quitting: 10.1   Smokeless tobacco: Never  Substance and Sexual Activity   Alcohol use: Yes    Alcohol/week: 21.0  standard drinks of alcohol    Types: 21 Shots of liquor per week    Comment: beer on weekends   Drug use: No   Sexual activity: Not on file  Other Topics Concern   Not on file  Social History Narrative   High school education    Works at [employer]   Married   One biological child    Social Determinants of Radio broadcast assistant Strain: Not on file  Food Insecurity: Not on file  Transportation Needs: Not on file  Physical Activity: Not on file  Stress: Not on file  Social Connections: Not on file  Intimate Partner Violence: Not on file    Past Surgical History:  Procedure Laterality Date   left forearm tendon repair     WRIST SURGERY      Family History  Problem Relation Age of Onset   Diabetes Mother    Hypertension Mother    Diabetes Father    Depression Brother    Diabetes Maternal Grandmother    Hypertension Maternal Grandmother    Diabetes Maternal Grandfather    Hypertension Maternal Grandfather    Colon cancer Neg Hx     No Known Allergies  Current Outpatient Medications on File Prior to  Visit  Medication Sig Dispense Refill   glipiZIDE (GLUCOTROL XL) 10 MG 24 hr tablet TAKE 1 TABLET BY MOUTH IN THE MORNING AND IN THE EVENING 180 tablet 0   hydrochlorothiazide (HYDRODIURIL) 25 MG tablet Take 1 tablet (25 mg total) by mouth daily. 90 tablet 3   ibuprofen (ADVIL) 600 MG tablet TAKE 1 TABLET BY MOUTH EVERY 6 HOURS AS NEEDED 90 tablet 0   metFORMIN (GLUCOPHAGE) 1000 MG tablet Take 1 tablet (1,000 mg total) by mouth 2 (two) times daily with a meal. 180 tablet 0   pravastatin (PRAVACHOL) 20 MG tablet Take 1 tablet (20 mg total) by mouth daily. 90 tablet 3   tadalafil (CIALIS) 20 MG tablet Take 1 tablet (20 mg total) by mouth every other day as needed for erectile dysfunction. 30 tablet 6   lisinopril (ZESTRIL) 30 MG tablet Take 1 tablet (30 mg total) by mouth daily. 90 tablet 3   No current facility-administered medications on file prior to visit.     BP 130/80   Pulse 80   Temp 98 F (36.7 C) (Oral)   Ht 5' 9"$  (1.753 m)   Wt 192 lb (87.1 kg)   SpO2 98%   BMI 28.35 kg/m       Objective:   Physical Exam Vitals and nursing note reviewed.  Constitutional:      Appearance: Normal appearance. He is obese.  Cardiovascular:     Rate and Rhythm: Normal rate and regular rhythm.     Pulses: Normal pulses.     Heart sounds: Normal heart sounds.  Pulmonary:     Effort: Pulmonary effort is normal.     Breath sounds: Normal breath sounds.  Skin:    General: Skin is warm and dry.  Neurological:     General: No focal deficit present.     Mental Status: He is alert and oriented to person, place, and time.  Psychiatric:        Mood and Affect: Mood normal.        Behavior: Behavior normal.        Thought Content: Thought content normal.        Judgment: Judgment normal.       Assessment & Plan:  1. Type 2 diabetes mellitus with other specified complication, without long-term current use of insulin (HCC)  - POC HgB A1c- 7.9 - has not improved. Will add Farxiga 5 mg. Education in the importance of taking medication BID all the time  - Follow up in 3 months  - glipiZIDE (GLUCOTROL XL) 10 MG 24 hr tablet; TAKE 1 TABLET BY MOUTH IN THE MORNING AND IN THE EVENING  Dispense: 180 tablet; Refill: 0 - metFORMIN (GLUCOPHAGE) 1000 MG tablet; Take 1 tablet (1,000 mg total) by mouth 2 (two) times daily with a meal.  Dispense: 180 tablet; Refill: 0 - dapagliflozin propanediol (FARXIGA) 5 MG TABS tablet; Take 1 tablet (5 mg total) by mouth daily before breakfast.  Dispense: 90 tablet; Refill: 0  2. Essential hypertension - Well controlled.  - No change in medication   Dorothyann Peng, NP

## 2023-01-17 NOTE — Telephone Encounter (Signed)
Pt came in at [facility] for an OV on 01/17/23.

## 2023-01-17 NOTE — Addendum Note (Signed)
Addended by: Gwenyth Ober R on: 01/17/2023 07:52 AM   Modules accepted: Orders

## 2023-04-06 ENCOUNTER — Other Ambulatory Visit: Payer: Self-pay | Admitting: Adult Health

## 2023-04-06 DIAGNOSIS — E1169 Type 2 diabetes mellitus with other specified complication: Secondary | ICD-10-CM

## 2023-04-18 ENCOUNTER — Ambulatory Visit (INDEPENDENT_AMBULATORY_CARE_PROVIDER_SITE_OTHER): Payer: BC Managed Care – PPO | Admitting: Adult Health

## 2023-04-18 ENCOUNTER — Encounter: Payer: Self-pay | Admitting: Adult Health

## 2023-04-18 VITALS — BP 122/80 | HR 78 | Temp 98.6°F | Ht 69.0 in | Wt 186.5 lb

## 2023-04-18 DIAGNOSIS — Z7984 Long term (current) use of oral hypoglycemic drugs: Secondary | ICD-10-CM

## 2023-04-18 DIAGNOSIS — I10 Essential (primary) hypertension: Secondary | ICD-10-CM

## 2023-04-18 DIAGNOSIS — E1169 Type 2 diabetes mellitus with other specified complication: Secondary | ICD-10-CM

## 2023-04-18 LAB — BASIC METABOLIC PANEL
BUN: 21 mg/dL (ref 6–23)
CO2: 28 mEq/L (ref 19–32)
Calcium: 10.4 mg/dL (ref 8.4–10.5)
Chloride: 101 mEq/L (ref 96–112)
Creatinine, Ser: 1.21 mg/dL (ref 0.40–1.50)
GFR: 63.47 mL/min (ref >60.00)
Glucose, Bld: 136 mg/dL — ABNORMAL HIGH (ref 70–99)
Potassium: 4.8 mEq/L (ref 3.5–5.1)
Sodium: 137 mEq/L (ref 135–145)

## 2023-04-18 LAB — HEMOGLOBIN A1C: Hgb A1c MFr Bld: 7.8 % — ABNORMAL HIGH (ref 4.6–6.5)

## 2023-04-18 LAB — MICROALBUMIN / CREATININE URINE RATIO
Creatinine,U: 59.8 mg/dL
Microalb Creat Ratio: 1.4 mg/g (ref 0.0–30.0)
Microalb, Ur: 0.9 mg/dL (ref 0.0–1.9)

## 2023-04-18 NOTE — Progress Notes (Signed)
Subjective:    Patient ID: Ronald Pierce, male    DOB: February 01, 1961, 62 y.o.   MRN: [id number]  HPI DM Type 2 -managed with glipizide 10 mg ER twice daily and metformin 1000 mg twice daily.  He does not monitor his blood sugars at home.  He denies episodes of hypoglycemia.  In the past we have had trouble with medication compliance with him only taking his glipizide and metformin once a day. Today he reports that he has been remembering to take both of his medications twice a day during the week  but will still forget to take it twice a day during the weekend. During his last visit Farxiga 5 mg was added. He has been tolerating this medication well.  Lab Results  Component Value Date   HGBA1C 7.9 (A) 01/17/2023   HTN -managed with HCTZ 25 mg daily and lisinopril 30 mg daily.  He denies dizziness, lightheadedness, chest pain, shortness of breath, or syncopal episodes. He does report that he had some biometric screening done back in August and his BP was elevated then at 134/96  BP Readings from Last 3 Encounters:  04/18/23 122/80  01/17/23 130/80  10/12/22 (!) 134/96   Review of Systems See HPI   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   Hyperlipidemia    Hypertension     Social History   Socioeconomic History   Marital status: Married    Spouse name: Not on file   Number of children: Not on file   Years of education: Not on file   Highest education level: Not on file  Occupational History   Not on file  Tobacco Use   Smoking status: Former    Types: Cigarettes    Quit date: 11/21/2012    Years since quitting: 10.4   Smokeless tobacco: Never  Substance and Sexual Activity   Alcohol use: Yes    Alcohol/week: 21.0 standard drinks of alcohol    Types: 21 Shots of liquor per week    Comment: beer on weekends   Drug use: No   Sexual activity: Not on file  Other  Topics Concern   Not on file  Social History Narrative   High school education    Works at [employer]   Married   One biological child    Social Determinants of Corporate investment banker Strain: Not on file  Food Insecurity: Not on file  Transportation Needs: Not on file  Physical Activity: Not on file  Stress: Not on file  Social Connections: Not on file  Intimate Partner Violence: Not on file    Past Surgical History:  Procedure Laterality Date   left forearm tendon repair     WRIST SURGERY      Family History  Problem Relation Age of Onset   Diabetes Mother    Hypertension Mother    Diabetes Father    Depression Brother    Diabetes Maternal Grandmother    Hypertension Maternal Grandmother    Diabetes Maternal Grandfather    Hypertension Maternal Grandfather    Colon cancer Neg Hx     No Known Allergies  Current Outpatient Medications on File Prior to Visit  Medication Sig Dispense Refill   FARXIGA 5 MG TABS tablet TAKE 1 TABLET BY MOUTH ONCE DAILY BEFORE BREAKFAST 90 tablet 0   glipiZIDE (  GLUCOTROL XL) 10 MG 24 hr tablet TAKE 1 TABLET BY MOUTH IN THE MORNING AND IN THE EVENING 180 tablet 0   hydrochlorothiazide (HYDRODIURIL) 25 MG tablet Take 1 tablet (25 mg total) by mouth daily. 90 tablet 3   ibuprofen (ADVIL) 600 MG tablet TAKE 1 TABLET BY MOUTH EVERY 6 HOURS AS NEEDED 90 tablet 0   metFORMIN (GLUCOPHAGE) 1000 MG tablet Take 1 tablet (1,000 mg total) by mouth 2 (two) times daily with a meal. 180 tablet 0   pravastatin (PRAVACHOL) 20 MG tablet Take 1 tablet (20 mg total) by mouth daily. 90 tablet 3   tadalafil (CIALIS) 20 MG tablet Take 1 tablet (20 mg total) by mouth every other day as needed for erectile dysfunction. 30 tablet 6   lisinopril (ZESTRIL) 30 MG tablet Take 1 tablet (30 mg total) by mouth daily. 90 tablet 3   No current facility-administered medications on file prior to visit.    BP 122/80   Pulse 78   Temp 98.6 F (37 C)   Ht 5\' 9"   (1.753 m)   Wt 186 lb 8 oz (84.6 kg)   SpO2 98%   BMI 27.54 kg/m       Objective:   Physical Exam Vitals and nursing note reviewed.  Constitutional:      Appearance: Normal appearance. He is obese.  Cardiovascular:     Rate and Rhythm: Normal rate and regular rhythm.     Pulses: Normal pulses.     Heart sounds: Normal heart sounds.  Pulmonary:     Effort: Pulmonary effort is normal.     Breath sounds: Normal breath sounds.  Skin:    General: Skin is warm and dry.  Neurological:     General: No focal deficit present.     Mental Status: He is alert and oriented to person, place, and time.  Psychiatric:        Mood and Affect: Mood normal.        Behavior: Behavior normal.        Thought Content: Thought content normal.        Judgment: Judgment normal.        Assessment & Plan:  1. Type 2 diabetes mellitus with other specified complication, without long-term current use of insulin (HCC) - needs to take his medication on the weekend.  - Hemoglobin A1c; Future - Basic Metabolic Panel; Future - Microalbumin/Creatinine Ratio, Urine; Future  2. Essential hypertension - Well controlled. No change in medication  - Hemoglobin A1c; Future - Basic Metabolic Panel; Future - Microalbumin/Creatinine Ratio, Urine; Future  Shirline Frees, NP

## 2023-04-18 NOTE — Patient Instructions (Addendum)
We will follow up with you regarding your lab work   Lets follow up in 3 months

## 2023-04-19 ENCOUNTER — Telehealth: Payer: Self-pay | Admitting: Adult Health

## 2023-04-19 ENCOUNTER — Other Ambulatory Visit: Payer: Self-pay | Admitting: Adult Health

## 2023-04-19 MED ORDER — DAPAGLIFLOZIN PROPANEDIOL 10 MG PO TABS: 10.0000 mg | ORAL_TABLET | Freq: Every day | ORAL | 0 refills | Status: DC

## 2023-04-19 NOTE — Telephone Encounter (Signed)
Prescription Request  04/19/2023  LOV: 04/18/2023  What is the name of the medication or equipment? dapagliflozin propanediol (FARXIGA) 10 MG TABS tablet   Have you contacted your pharmacy to request a refill? Yes  Pt states he went to the pharmacy earlier and was told they only have the 5 mg Rx for him. Pt states it is supposed to be 10 mg.  Pt is asking if 10 mg was okayed?  Which pharmacy would you like this sent to?  [id number] - [place] ([place]), [place] - [address] [place] ([place]) [place] [address] Phone: [phone] Fax: [phone]    Patient notified that their request is being sent to the clinical staff for review and that they should receive a response within 2 business days.   Please advise at Mobile [phone] (mobile)

## 2023-04-19 NOTE — Telephone Encounter (Signed)
Rx sent. Pt was aware. See lab note.

## 2023-04-30 ENCOUNTER — Other Ambulatory Visit: Payer: Self-pay | Admitting: Adult Health

## 2023-04-30 DIAGNOSIS — Z760 Encounter for issue of repeat prescription: Secondary | ICD-10-CM
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
## 2023-05-02 NOTE — Telephone Encounter (Signed)
Okay for refill?  

## 2023-06-13 DIAGNOSIS — E119 Type 2 diabetes mellitus without complications: Secondary | ICD-10-CM | POA: Diagnosis not present

## 2023-06-13 LAB — HM DIABETES EYE EXAM

## 2023-07-13 ENCOUNTER — Other Ambulatory Visit: Payer: Self-pay | Admitting: Adult Health

## 2023-07-17 NOTE — Telephone Encounter (Signed)
Pt has one pill left

## 2023-07-25 ENCOUNTER — Ambulatory Visit (INDEPENDENT_AMBULATORY_CARE_PROVIDER_SITE_OTHER): Payer: BC Managed Care – PPO | Admitting: Adult Health

## 2023-07-25 ENCOUNTER — Encounter: Payer: Self-pay | Admitting: Adult Health

## 2023-07-25 VITALS — BP 120/80 | HR 76 | Temp 97.9°F | Ht 69.0 in | Wt 181.0 lb

## 2023-07-25 DIAGNOSIS — I10 Essential (primary) hypertension: Secondary | ICD-10-CM | POA: Diagnosis not present

## 2023-07-25 DIAGNOSIS — Z7984 Long term (current) use of oral hypoglycemic drugs: Secondary | ICD-10-CM | POA: Diagnosis not present

## 2023-07-25 DIAGNOSIS — E119 Type 2 diabetes mellitus without complications: Secondary | ICD-10-CM | POA: Diagnosis not present

## 2023-07-25 DIAGNOSIS — R5383 Other fatigue: Secondary | ICD-10-CM | POA: Diagnosis not present

## 2023-07-25 LAB — BASIC METABOLIC PANEL
BUN: 34 mg/dL — ABNORMAL HIGH (ref 6–23)
CO2: 25 mEq/L (ref 19–32)
Calcium: 10.7 mg/dL — ABNORMAL HIGH (ref 8.4–10.5)
Chloride: 101 mEq/L (ref 96–112)
Creatinine, Ser: 1.31 mg/dL (ref 0.40–1.50)
GFR: 57.6 mL/min — ABNORMAL LOW (ref >60.00)
Glucose, Bld: 115 mg/dL — ABNORMAL HIGH (ref 70–99)
Potassium: 4.5 mEq/L (ref 3.5–5.1)
Sodium: 135 mEq/L (ref 135–145)

## 2023-07-25 LAB — TESTOSTERONE: Testosterone: 285.26 ng/dL — ABNORMAL LOW (ref 300.00–890.00)

## 2023-07-25 LAB — POCT GLYCOSYLATED HEMOGLOBIN (HGB A1C): Hemoglobin A1C: 7.1 % — AB (ref 4.0–5.6)

## 2023-07-25 LAB — VITAMIN D 25 HYDROXY (VIT D DEFICIENCY, FRACTURES): VITD: 25.68 ng/mL — ABNORMAL LOW (ref 30.00–100.00)

## 2023-07-25 MED ORDER — METFORMIN HCL 1000 MG PO TABS: 1000.0000 mg | ORAL_TABLET | Freq: Two times a day (BID) | ORAL | 0 refills | Status: DC

## 2023-07-25 MED ORDER — GLIPIZIDE ER 10 MG PO TB24: ORAL_TABLET | ORAL | 0 refills | Status: DC

## 2023-07-25 NOTE — Progress Notes (Signed)
Subjective:    Patient ID: Ronald Pierce, male    DOB: June 16, 1961, 62 y.o.   MRN: [id number]  HPI 62 year old male who  has a past medical history of Diabetes mellitus without complication (HCC), Drug abuse (HCC), Hyperlipidemia, and Hypertension.  DM Type 2 -managed with glipizide 10 mg ER twice daily, metformin 1000 mg twice daily and Farxiga 10 mg daily.  He does not monitor his blood sugars at home.  He denies episodes of hypoglycemia.  In the past we have had trouble with medication compliance with him only taking his glipizide and metformin once a day. Today he reports that he has been remembering to take both of his medications twice a day during the week  but will still forget to take it twice a day during the weekend.  Lab Results  Component Value Date   HGBA1C 7.8 (H) 04/18/2023   Wt Readings from Last 3 Encounters:  07/25/23 181 lb (82.1 kg)  04/18/23 186 lb 8 oz (84.6 kg)  01/17/23 192 lb (87.1 kg)   HTN -managed with HCTZ 25 mg daily and lisinopril 30 mg daily.  He denies dizziness, lightheadedness, chest pain, shortness of breath, or syncopal episodes. He does report that he had some biometric screening done back in August and his BP was elevated then at 134/96  BP Readings from Last 3 Encounters:  07/25/23 100/62  04/18/23 122/80  01/17/23 130/80   Fatigue - reports that he feels as though he has no energy and has some sexual dysfunction with ED. He does work a lot and has a feeling that this is leading to the fatigue aspect. He would like to check his testosterone today. He denies fevers and chills. Feels like fatigue is causing him to be slightly depressed.    Review of Systems See HPI   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   Hyperlipidemia    Hypertension     Social History   Socioeconomic History   Marital status: Married    Spouse name: Not on  file   Number of children: Not on file   Years of education: Not on file   Highest education level: Not on file  Occupational History   Not on file  Tobacco Use   Smoking status: Former    Current packs/day: 0.00    Types: Cigarettes    Quit date: 11/21/2012    Years since quitting: 10.6   Smokeless tobacco: Never  Substance and Sexual Activity   Alcohol use: Yes    Alcohol/week: 21.0 standard drinks of alcohol    Types: 21 Shots of liquor per week    Comment: beer on weekends   Drug use: No   Sexual activity: Not on file  Other Topics Concern   Not on file  Social History Narrative   High school education    Works at [employer]   Married   One biological child    Social Determinants of Health   Financial Resource Strain: Not on file  Food Insecurity: Not on file  Transportation Needs: Not on file  Physical Activity: Not on file  Stress: Not on file  Social Connections: Not on file  Intimate Partner Violence: Not on file    Past Surgical History:  Procedure Laterality Date   left forearm tendon repair     WRIST  SURGERY      Family History  Problem Relation Age of Onset   Diabetes Mother    Hypertension Mother    Diabetes Father    Depression Brother    Diabetes Maternal Grandmother    Hypertension Maternal Grandmother    Diabetes Maternal Grandfather    Hypertension Maternal Grandfather    Colon cancer Neg Hx     No Known Allergies  Current Outpatient Medications on File Prior to Visit  Medication Sig Dispense Refill   FARXIGA 10 MG TABS tablet TAKE 1 TABLET BY MOUTH ONCE DAILY BEFORE BREAKFAST (REPLACES  5  MG  DOSE) 90 tablet 0   glipiZIDE (GLUCOTROL XL) 10 MG 24 hr tablet TAKE 1 TABLET BY MOUTH IN THE MORNING AND IN THE EVENING 180 tablet 0   hydrochlorothiazide (HYDRODIURIL) 25 MG tablet Take 1 tablet (25 mg total) by mouth daily. 90 tablet 3   ibuprofen (ADVIL) 600 MG tablet TAKE 1 TABLET BY MOUTH EVERY 6 HOURS AS NEEDED 90 tablet 0    lisinopril (ZESTRIL) 30 MG tablet Take 1 tablet (30 mg total) by mouth daily. 90 tablet 3   metFORMIN (GLUCOPHAGE) 1000 MG tablet Take 1 tablet (1,000 mg total) by mouth 2 (two) times daily with a meal. 180 tablet 0   pravastatin (PRAVACHOL) 20 MG tablet Take 1 tablet (20 mg total) by mouth daily. 90 tablet 3   tadalafil (CIALIS) 20 MG tablet Take 1 tablet (20 mg total) by mouth every other day as needed for erectile dysfunction. 30 tablet 6   No current facility-administered medications on file prior to visit.    BP 100/62   Pulse 76   Temp 97.9 F (36.6 C) (Oral)   Ht 5\' 9"  (1.753 m)   Wt 181 lb (82.1 kg)   SpO2 97%   BMI 26.73 kg/m       Objective:   Physical Exam Vitals and nursing note reviewed.  Constitutional:      Appearance: Normal appearance. He is obese.  Cardiovascular:     Rate and Rhythm: Normal rate and regular rhythm.     Pulses: Normal pulses.     Heart sounds: Normal heart sounds.  Pulmonary:     Effort: Pulmonary effort is normal.     Breath sounds: Normal breath sounds.  Skin:    General: Skin is warm and dry.  Neurological:     General: No focal deficit present.     Mental Status: He is alert and oriented to person, place, and time.  Psychiatric:        Mood and Affect: Mood normal.        Behavior: Behavior normal.        Thought Content: Thought content normal.        Judgment: Judgment normal.       Assessment & Plan:  1. Essential hypertension - well controlled. No change in medication  - Basic Metabolic Panel; Future  2. Diabetes mellitus treated with oral medication (HCC)  - Basic Metabolic Panel; Future - glipiZIDE (GLUCOTROL XL) 10 MG 24 hr tablet; TAKE 1 TABLET BY MOUTH IN THE MORNING AND IN THE EVENING  Dispense: 180 tablet; Refill: 0 - metFORMIN (GLUCOPHAGE) 1000 MG tablet; Take 1 tablet (1,000 mg total) by mouth 2 (two) times daily with a meal.  Dispense: 180 tablet; Refill: 0 - POC HgB A1c- 7.1  - Continue with current  therapy - Follow up in three months   3. Other fatigue  - Testosterone;  Future - VITAMIN D 25 Hydroxy (Vit-D Deficiency, Fractures); Future  Shirline Frees, NP

## 2023-07-25 NOTE — Patient Instructions (Signed)
It was great seeing you today. Your A1c was 7.1 this is improved!   I am going to check a testosterone level and Vitamin D on you today

## 2023-07-30 ENCOUNTER — Telehealth: Payer: Self-pay | Admitting: Adult Health

## 2023-07-30 DIAGNOSIS — E349 Endocrine disorder, unspecified: Secondary | ICD-10-CM

## 2023-07-30 DIAGNOSIS — R7989 Other specified abnormal findings of blood chemistry: Secondary | ICD-10-CM

## 2023-07-30 NOTE — Telephone Encounter (Signed)
Pt is calling back for his blood work results and does having something to write his results down

## 2023-07-31 NOTE — Telephone Encounter (Signed)
Called pt back but he was otw to work and had no way of writing. Per pt request information was left of answering machine. Pt would like the order for urology to be placed. Referall for uro placed. No further actions needed.

## 2023-09-04 DIAGNOSIS — M109 Gout, unspecified: Secondary | ICD-10-CM | POA: Diagnosis not present

## 2023-09-04 DIAGNOSIS — M79675 Pain in left toe(s): Secondary | ICD-10-CM | POA: Diagnosis not present

## 2023-09-12 DIAGNOSIS — R3912 Poor urinary stream: Secondary | ICD-10-CM | POA: Diagnosis not present

## 2023-09-12 DIAGNOSIS — N5201 Erectile dysfunction due to arterial insufficiency: Secondary | ICD-10-CM | POA: Diagnosis not present

## 2023-09-12 DIAGNOSIS — E291 Testicular hypofunction: Secondary | ICD-10-CM | POA: Diagnosis not present

## 2023-09-12 DIAGNOSIS — N401 Enlarged prostate with lower urinary tract symptoms: Secondary | ICD-10-CM | POA: Diagnosis not present

## 2023-09-12 DIAGNOSIS — Z125 Encounter for screening for malignant neoplasm of prostate: Secondary | ICD-10-CM | POA: Diagnosis not present

## 2023-09-21 ENCOUNTER — Other Ambulatory Visit: Payer: Self-pay | Admitting: Adult Health

## 2023-09-21 DIAGNOSIS — I10 Essential (primary) hypertension: Secondary | ICD-10-CM

## 2023-10-03 ENCOUNTER — Ambulatory Visit (INDEPENDENT_AMBULATORY_CARE_PROVIDER_SITE_OTHER): Payer: BC Managed Care – PPO | Admitting: Adult Health

## 2023-10-03 ENCOUNTER — Encounter: Payer: Self-pay | Admitting: Adult Health

## 2023-10-03 VITALS — BP 120/80 | HR 74 | Temp 97.7°F | Wt 183.0 lb

## 2023-10-03 DIAGNOSIS — M109 Gout, unspecified: Secondary | ICD-10-CM | POA: Diagnosis not present

## 2023-10-03 MED ORDER — PREDNISONE 10 MG PO TABS
10.0000 mg | ORAL_TABLET | Freq: Every day | ORAL | 0 refills | Status: DC
Start: 2023-10-03 — End: 2023-10-31

## 2023-10-03 NOTE — Progress Notes (Signed)
Subjective:    Patient ID: Ronald Pierce, male    DOB: [birth year], 62 y.o.   MRN: [id number]  HPI  62 year old male who  has a past medical history of Diabetes mellitus without complication (HCC), Drug abuse (HCC), Hyperlipidemia, and Hypertension.  He presents to the office today for concern of recurrent gout flare in his left great toe. He was seen about a month ago at Urgent Care for the same issue and was prescribed Colchicine. He reports that colchicine helped but did not resolve the pain completely. About a week later he was back to having redness, warmth, and swelling. 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   Hyperlipidemia    Hypertension     Social History   Socioeconomic History   Marital status: Married    Spouse name: Not on file   Number of children: Not on file   Years of education: Not on file   Highest education level: Not on file  Occupational History   Not on file  Tobacco Use   Smoking status: Former    Current packs/day: 0.00    Types: Cigarettes    Quit date: 11/21/2012    Years since quitting: 10.8   Smokeless tobacco: Never  Substance and Sexual Activity   Alcohol use: Yes    Alcohol/week: 21.0 standard drinks of alcohol    Types: 21 Shots of liquor per week    Comment: beer on weekends   Drug use: No   Sexual activity: Not on file  Other Topics Concern   Not on file  Social History Narrative   High school education    Works at [employer]   Married   One biological child    Social Determinants of Corporate investment banker Strain: Not on file  Food Insecurity: Not on file  Transportation Needs: Not on file  Physical Activity: Not on file  Stress: Not on file  Social Connections: Not on file  Intimate Partner Violence: Not on  file    Past Surgical History:  Procedure Laterality Date   left forearm tendon repair     WRIST SURGERY      Family History  Problem Relation Age of Onset   Diabetes Mother    Hypertension Mother    Diabetes Father    Depression Brother    Diabetes Maternal Grandmother    Hypertension Maternal Grandmother    Diabetes Maternal Grandfather    Hypertension Maternal Grandfather    Colon cancer Neg Hx     No Known Allergies  Current Outpatient Medications on File Prior to Visit  Medication Sig Dispense Refill   FARXIGA 10 MG TABS tablet TAKE 1 TABLET BY MOUTH ONCE DAILY BEFORE BREAKFAST (REPLACES  5  MG  DOSE) 90 tablet 0   glipiZIDE (GLUCOTROL XL) 10 MG 24 hr tablet TAKE 1 TABLET BY MOUTH IN THE MORNING AND IN THE EVENING 180 tablet 0   hydrochlorothiazide (HYDRODIURIL) 25 MG tablet Take 1 tablet by mouth once daily 90 tablet 0   ibuprofen (ADVIL) 600 MG tablet TAKE 1 TABLET BY MOUTH EVERY 6 HOURS AS NEEDED 90 tablet 0   lisinopril (ZESTRIL) 30 MG tablet Take 1 tablet (30 mg total)  by mouth daily. 90 tablet 3   metFORMIN (GLUCOPHAGE) 1000 MG tablet Take 1 tablet (1,000 mg total) by mouth 2 (two) times daily with a meal. 180 tablet 0   pravastatin (PRAVACHOL) 20 MG tablet Take 1 tablet (20 mg total) by mouth daily. 90 tablet 3   tadalafil (CIALIS) 20 MG tablet Take 1 tablet (20 mg total) by mouth every other day as needed for erectile dysfunction. 30 tablet 6   No current facility-administered medications on file prior to visit.    BP 120/80   Pulse 74   Temp 97.7 F (36.5 C) (Oral)   Wt 183 lb (83 kg)   SpO2 99%   BMI 27.02 kg/m       Objective:   Physical Exam Vitals and nursing note reviewed.  Constitutional:      Appearance: Normal appearance.  Cardiovascular:     Rate and Rhythm: Normal rate and regular rhythm.     Pulses: Normal pulses.     Heart sounds: Normal heart sounds.  Pulmonary:     Effort: Pulmonary effort is normal.     Breath sounds: Normal  breath sounds.  Musculoskeletal:        General: Swelling present.  Feet:     Comments: Redness,warmth, swelling and pain noted to left great toe  Skin:    General: Skin is dry.     Findings: Erythema present.  Neurological:     General: No focal deficit present.     Mental Status: He is alert and oriented to person, place, and time.  Psychiatric:        Mood and Affect: Mood normal.        Behavior: Behavior normal.        Thought Content: Thought content normal.        Judgment: Judgment normal.       Assessment & Plan:  1. Acute gout involving toe of left foot, unspecified cause - Will send in 5 days of prednisone. Advised to drink more water this week to keep his BS down - Refrain from alcohol, fish and organ meats.  - Follow up if not resolving  - predniSONE (DELTASONE) 10 MG tablet; Take 1 tablet (10 mg total) by mouth daily with breakfast.  Dispense: 5 tablet; Refill: 0   Shirline Frees, NP

## 2023-10-05 ENCOUNTER — Other Ambulatory Visit: Payer: Self-pay | Admitting: Adult Health

## 2023-10-05 DIAGNOSIS — E7800 Pure hypercholesterolemia, unspecified: Secondary | ICD-10-CM

## 2023-10-12 ENCOUNTER — Other Ambulatory Visit: Payer: Self-pay | Admitting: Adult Health

## 2023-10-12 DIAGNOSIS — I10 Essential (primary) hypertension: Secondary | ICD-10-CM

## 2023-10-15 ENCOUNTER — Telehealth: Payer: Self-pay | Admitting: Adult Health

## 2023-10-15 MED ORDER — FARXIGA 10 MG PO TABS: ORAL_TABLET | ORAL | 0 refills | Status: DC

## 2023-10-15 NOTE — Telephone Encounter (Signed)
Prescription sent to pharmacy. No other action needed.  

## 2023-10-15 NOTE — Telephone Encounter (Signed)
Prescription Request  10/15/2023  LOV: 10/03/2023  What is the name of the medication or equipment? FARXIGA 10 MG TABS tablet   Have you contacted your pharmacy to request a refill? No   Which pharmacy would you like this sent to?  [id number] - [place] ([place]), [place] - [address] [place] ([place]) [place] [address] Phone: [phone] Fax: [phone]    Patient notified that their request is being sent to the clinical staff for review and that they should receive a response within 2 business days.   Please advise at Mobile [phone] (mobile)

## 2023-10-31 ENCOUNTER — Encounter: Payer: Self-pay | Admitting: Adult Health

## 2023-10-31 ENCOUNTER — Ambulatory Visit: Payer: BC Managed Care – PPO | Admitting: Adult Health

## 2023-10-31 VITALS — BP 130/84 | HR 82 | Temp 97.5°F | Ht 69.0 in | Wt 185.6 lb

## 2023-10-31 DIAGNOSIS — I10 Essential (primary) hypertension: Secondary | ICD-10-CM | POA: Diagnosis not present

## 2023-10-31 DIAGNOSIS — Z7984 Long term (current) use of oral hypoglycemic drugs: Secondary | ICD-10-CM | POA: Diagnosis not present

## 2023-10-31 DIAGNOSIS — M1991 Primary osteoarthritis, unspecified site: Secondary | ICD-10-CM | POA: Diagnosis not present

## 2023-10-31 DIAGNOSIS — E119 Type 2 diabetes mellitus without complications: Secondary | ICD-10-CM

## 2023-10-31 LAB — POCT GLYCOSYLATED HEMOGLOBIN (HGB A1C): Hemoglobin A1C: 7.6 % — AB (ref 4.0–5.6)

## 2023-10-31 MED ORDER — METFORMIN HCL 1000 MG PO TABS
1000.0000 mg | ORAL_TABLET | Freq: Two times a day (BID) | ORAL | 0 refills | Status: DC
Start: 2023-10-31 — End: 2024-01-29

## 2023-10-31 MED ORDER — IBUPROFEN 600 MG PO TABS
600.0000 mg | ORAL_TABLET | Freq: Four times a day (QID) | ORAL | 0 refills | Status: DC | PRN
Start: 2023-10-31 — End: 2024-07-17

## 2023-10-31 NOTE — Progress Notes (Signed)
Subjective:    Patient ID: Ronald Pierce, male    DOB: 1961/01/26, 62 y.o.   MRN: [id number]  HPI  62 year old male who  has a past medical history of Diabetes mellitus without complication (HCC), Drug abuse (HCC), Hyperlipidemia, and Hypertension.  He presents to the office today for follow up regarding DM and HTN   DM Type 2 -managed with glipizide 10 mg ER twice daily, metformin 1000 mg twice daily and Farxiga 10 mg daily.  He does not monitor his blood sugars at home.  He denies episodes of hypoglycemia. He has had a " sweet tooth" lately and his diet has suffered. He does not exercise outside of work Lab Results  Component Value Date   HGBA1C 7.6 (A) 10/31/2023   HGBA1C 7.1 (A) 07/25/2023   HGBA1C 7.8 (H) 04/18/2023   Wt Readings from Last 3 Encounters:  10/31/23 185 lb 9.6 oz (84.2 kg)  10/03/23 183 lb (83 kg)  07/25/23 181 lb (82.1 kg)   HTN -managed with HCTZ 25 mg daily and lisinopril 30 mg daily.  He denies dizziness, lightheadedness, chest pain, shortness of breath, or syncopal episodes.  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   Hyperlipidemia    Hypertension     Social History   Socioeconomic History   Marital status: Married    Spouse name: Not on file   Number of children: Not on file   Years of education: Not on file   Highest education level: Not on file  Occupational History   Not on file  Tobacco Use   Smoking status: Former    Current packs/day: 0.00    Types: Cigarettes    Quit date: 11/21/2012    Years since quitting: 10.9   Smokeless tobacco: Never  Substance and Sexual Activity   Alcohol use: Yes    Alcohol/week: 21.0 standard drinks of alcohol    Types: 21 Shots of liquor per week    Comment: beer on weekends    Drug use: No   Sexual activity: Not on file  Other Topics Concern   Not on file  Social History Narrative   High school education    Works at [employer]   Married   One biological child    Social Determinants of Corporate investment banker Strain: Not on file  Food Insecurity: Not on file  Transportation Needs: Not on file  Physical Activity: Not on file  Stress: Not on file  Social Connections: Not on file  Intimate Partner Violence: Not on file    Past Surgical History:  Procedure Laterality Date   left forearm tendon repair     WRIST SURGERY      Family History  Problem Relation Age of Onset   Diabetes Mother    Hypertension Mother    Diabetes Father    Depression Brother    Diabetes Maternal Grandmother    Hypertension Maternal Grandmother    Diabetes Maternal Grandfather    Hypertension Maternal Grandfather    Colon cancer Neg Hx     No Known Allergies  Current Outpatient Medications on File Prior to Visit  Medication Sig Dispense Refill   FARXIGA 10 MG TABS tablet  TAKE 1 TABLET BY MOUTH ONCE DAILY BEFORE BREAKFAST (REPLACES  5  MG  DOSE) 270 tablet 0   glipiZIDE (GLUCOTROL XL) 10 MG 24 hr tablet TAKE 1 TABLET BY MOUTH IN THE MORNING AND IN THE EVENING 180 tablet 0   hydrochlorothiazide (HYDRODIURIL) 25 MG tablet Take 1 tablet by mouth once daily 90 tablet 0   ibuprofen (ADVIL) 600 MG tablet TAKE 1 TABLET BY MOUTH EVERY 6 HOURS AS NEEDED 90 tablet 0   lisinopril (ZESTRIL) 30 MG tablet Take 1 tablet by mouth once daily 90 tablet 0   metFORMIN (GLUCOPHAGE) 1000 MG tablet Take 1 tablet (1,000 mg total) by mouth 2 (two) times daily with a meal. 180 tablet 0   pravastatin (PRAVACHOL) 20 MG tablet Take 1 tablet by mouth once daily 90 tablet 0   tadalafil (CIALIS) 20 MG tablet Take 1 tablet (20 mg total) by mouth every other day as needed for erectile dysfunction. 30 tablet 6   No current facility-administered medications on file prior to visit.    BP  130/84   Pulse 82   Temp (!) 97.5 F (36.4 C) (Oral)   Ht 5\' 9"  (1.753 m)   Wt 185 lb 9.6 oz (84.2 kg)   SpO2 98%   BMI 27.41 kg/m       Objective:   Physical Exam Vitals and nursing note reviewed.  Constitutional:      Appearance: Normal appearance. He is obese.  Cardiovascular:     Rate and Rhythm: Normal rate and regular rhythm.     Pulses: Normal pulses.     Heart sounds: Normal heart sounds.  Pulmonary:     Effort: Pulmonary effort is normal.     Breath sounds: Normal breath sounds.  Skin:    General: Skin is warm and dry.  Neurological:     General: No focal deficit present.     Mental Status: He is alert and oriented to person, place, and time.  Psychiatric:        Mood and Affect: Mood normal.        Behavior: Behavior normal.        Thought Content: Thought content normal.        Judgment: Judgment normal.       Assessment & Plan:  1. Diabetes mellitus treated with oral medication (HCC)  - POC HgB A1c- 7.6 - not at goal and has increased.  - He does not want any injectable medication.   - He needs to do better with refraining from sweets and carbs - Needs to start exercising  - Reviewed complications of diabetes - Follow up in 3 months  - metFORMIN (GLUCOPHAGE) 1000 MG tablet; Take 1 tablet (1,000 mg total) by mouth 2 (two) times daily with a meal.  Dispense: 180 tablet; Refill: 0  2. Essential hypertension - Controlled. No change in medication   3. Primary osteoarthritis, unspecified site - needs refill  - ibuprofen (ADVIL) 600 MG tablet; Take 1 tablet (600 mg total) by mouth every 6 (six) hours as needed.  Dispense: 90 tablet; Refill: 0  Shirline Frees, NP

## 2023-12-11 ENCOUNTER — Other Ambulatory Visit: Payer: Self-pay | Admitting: Adult Health

## 2023-12-11 DIAGNOSIS — E119 Type 2 diabetes mellitus without complications: Secondary | ICD-10-CM

## 2024-01-04 ENCOUNTER — Other Ambulatory Visit: Payer: Self-pay | Admitting: Adult Health

## 2024-01-04 DIAGNOSIS — E7800 Pure hypercholesterolemia, unspecified: Secondary | ICD-10-CM

## 2024-01-09 ENCOUNTER — Other Ambulatory Visit: Payer: Self-pay | Admitting: Adult Health

## 2024-01-09 DIAGNOSIS — E7800 Pure hypercholesterolemia, unspecified: Secondary | ICD-10-CM

## 2024-01-09 NOTE — Telephone Encounter (Signed)
Medication refilled yesterday.

## 2024-01-18 ENCOUNTER — Other Ambulatory Visit: Payer: Self-pay | Admitting: Adult Health

## 2024-01-18 DIAGNOSIS — I10 Essential (primary) hypertension: Secondary | ICD-10-CM

## 2024-01-21 ENCOUNTER — Other Ambulatory Visit: Payer: Self-pay | Admitting: Adult Health

## 2024-01-21 DIAGNOSIS — I10 Essential (primary) hypertension: Secondary | ICD-10-CM

## 2024-01-21 NOTE — Telephone Encounter (Signed)
 Last Fill: 10/15/23  Last OV: 10/31/23 Next OV: 02/20/24  Routing to provider for review/authorization.

## 2024-01-21 NOTE — Telephone Encounter (Signed)
 Copied from CRM [id number]. Topic: Clinical - Medication Refill >> Jan 21, 2024 10:55 AM Isabell A wrote: Most Recent Primary Care Visit:  Provider: Shirline Frees  Department: [facility]  Visit Type: OFFICE VISIT  Date: 10/31/2023  Medication: lisinopril (ZESTRIL) 30 MG tablet  Has the patient contacted their pharmacy? Yes (Agent: If no, request that the patient contact the pharmacy for the refill. If patient does not wish to contact the pharmacy document the reason why and proceed with request.) (Agent: If yes, when and what did the pharmacy advise?)  Is this the correct pharmacy for this prescription? Yes If no, delete pharmacy and type the correct one.  This is the patient's preferred pharmacy:  [id number] - [place] ([place]), [place] - [address] [place] ([place]) [place] [address] Phone: [phone] Fax: [phone]   Has the prescription been filled recently? Yes  Is the patient out of the medication? Yes  Has the patient been seen for an appointment in the last year OR does the patient have an upcoming appointment? Yes  Can we respond through MyChart? No  Agent: Please be advised that Rx refills may take up to 3 business days. We ask that you follow-up with your pharmacy.

## 2024-01-22 MED ORDER — LISINOPRIL 30 MG PO TABS: 30.0000 mg | ORAL_TABLET | Freq: Every day | ORAL | 0 refills | Status: DC

## 2024-01-27 ENCOUNTER — Other Ambulatory Visit: Payer: Self-pay | Admitting: Adult Health

## 2024-01-27 DIAGNOSIS — E119 Type 2 diabetes mellitus without complications: Secondary | ICD-10-CM

## 2024-01-30 ENCOUNTER — Other Ambulatory Visit: Payer: Self-pay | Admitting: Adult Health

## 2024-01-30 DIAGNOSIS — I10 Essential (primary) hypertension: Secondary | ICD-10-CM

## 2024-01-30 MED ORDER — HYDROCHLOROTHIAZIDE 25 MG PO TABS
25.0000 mg | ORAL_TABLET | Freq: Every day | ORAL | 0 refills | Status: DC
Start: 2024-01-30 — End: 2024-04-22

## 2024-01-30 NOTE — Telephone Encounter (Signed)
 Copied from CRM [id number]. Topic: Clinical - Medication Refill >> Jan 30, 2024 10:37 AM Lennart Pall wrote: Most Recent Primary Care Visit:  Provider: Shirline Frees  Department: [facility]  Visit Type: OFFICE VISIT  Date: 10/31/2023  Medication: hydrochlorothiazide (HYDRODIURIL) 25 MG tablet  Has the patient contacted their pharmacy? Yes (Agent: If no, request that the patient contact the pharmacy for the refill. If patient does not wish to contact the pharmacy document the reason why and proceed with request.) (Agent: If yes, when and what did the pharmacy advise?)  Is this the correct pharmacy for this prescription? Yes If no, delete pharmacy and type the correct one.  This is the patient's preferred pharmacy:  [id number] - [place] ([place]), [place] - [address] [place] ([place]) [place] [address] Phone: [phone] Fax: [phone]   Has the prescription been filled recently? Yes  Is the patient out of the medication? Yes  Has the patient been seen for an appointment in the last year OR does the patient have an upcoming appointment? Yes  Can we respond through MyChart? Yes  Agent: Please be advised that Rx refills may take up to 3 business days. We ask that you follow-up with your pharmacy.

## 2024-02-20 ENCOUNTER — Ambulatory Visit (INDEPENDENT_AMBULATORY_CARE_PROVIDER_SITE_OTHER): Payer: BC Managed Care – PPO | Admitting: Adult Health

## 2024-02-20 VITALS — BP 130/70 | HR 70 | Temp 97.7°F | Ht 69.0 in | Wt 185.0 lb

## 2024-02-20 DIAGNOSIS — I10 Essential (primary) hypertension: Secondary | ICD-10-CM

## 2024-02-20 DIAGNOSIS — E119 Type 2 diabetes mellitus without complications: Secondary | ICD-10-CM | POA: Diagnosis not present

## 2024-02-20 DIAGNOSIS — Z7984 Long term (current) use of oral hypoglycemic drugs: Secondary | ICD-10-CM | POA: Diagnosis not present

## 2024-02-20 LAB — POCT GLYCOSYLATED HEMOGLOBIN (HGB A1C): Hemoglobin A1C: 7.5 % — AB (ref 4.0–5.6)

## 2024-02-20 MED ORDER — FARXIGA 10 MG PO TABS: ORAL_TABLET | ORAL | 0 refills | Status: DC

## 2024-02-20 MED ORDER — GLIPIZIDE ER 10 MG PO TB24: ORAL_TABLET | ORAL | 0 refills | Status: DC

## 2024-02-20 NOTE — Patient Instructions (Signed)
 Health Maintenance Due  Topic Date Due   INFLUENZA VACCINE  06/27/2023   COVID-19 Vaccine (3 - 2024-25 season) 07/28/2023   Pneumococcal Vaccine 32-65 Years old (3 of 3 - PPSV23 or PCV20) 03/24/2024       10/03/2023    7:28 AM 07/25/2023    7:30 AM 04/18/2023    7:09 AM  Depression screen PHQ 2/9  Decreased Interest 1 3 0  Down, Depressed, Hopeless 1 1 0  PHQ - 2 Score 2 4 0  Altered sleeping 1 2   Tired, decreased energy 2 3   Change in appetite 0 0   Feeling bad or failure about yourself  0 0   Trouble concentrating 1 0   Moving slowly or fidgety/restless 1 1   Suicidal thoughts 0 0   PHQ-9 Score 7 10   Difficult doing work/chores Not difficult at all Somewhat difficult

## 2024-02-20 NOTE — Progress Notes (Signed)
 Subjective:    Patient ID: Ronald Pierce, male    DOB: [birth year], 63 y.o.   MRN: [id number]  HPI 63 year old male who  has a past medical history of Diabetes mellitus without complication (HCC), Drug abuse (HCC), Hyperlipidemia, and Hypertension.  He presents to the office today for follow up regarding DM and HTN   DM Type 2 -managed with glipizide 10 mg ER twice daily, metformin 1000 mg twice daily and Farxiga 10 mg daily.  He does not monitor his blood sugars at home.  He denies episodes of hypoglycemia. He has had a " sweet tooth" lately and his diet has suffered. He does not exercise outside of work.  He has been drinking more beer on the weekends, usually a 12 pack in two days.  Lab Results  Component Value Date   HGBA1C 7.5 (A) 02/20/2024   HGBA1C 7.6 (A) 10/31/2023   HGBA1C 7.1 (A) 07/25/2023   Wt Readings from Last 3 Encounters:  02/20/24 185 lb (83.9 kg)  10/31/23 185 lb 9.6 oz (84.2 kg)  10/03/23 183 lb (83 kg)   HTN -managed with HCTZ 25 mg daily and lisinopril 30 mg daily.  He denies dizziness, lightheadedness, chest pain, shortness of breath, or syncopal episodes.  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   Hyperlipidemia    Hypertension     Social History   Socioeconomic History   Marital status: Married    Spouse name: Not on file   Number of children: Not on file   Years of education: Not on file   Highest education level: Not on file  Occupational History   Not on file  Tobacco Use   Smoking status: Former    Current packs/day: 0.00    Types: Cigarettes    Quit date: 11/21/2012    Years since quitting: 11.2   Smokeless tobacco: Never  Substance and Sexual Activity   Alcohol use: Yes    Alcohol/week: 21.0 standard drinks  of alcohol    Types: 21 Shots of liquor per week    Comment: beer on weekends   Drug use: No   Sexual activity: Not on file  Other Topics Concern   Not on file  Social History Narrative   High school education    Works at [employer]   Married   One biological child    Social Drivers of Corporate investment banker Strain: Not on file  Food Insecurity: Not on file  Transportation Needs: Not on file  Physical Activity: Not on file  Stress: Not on file  Social Connections: Not on file  Intimate Partner Violence: Not on file    Past Surgical History:  Procedure Laterality Date   left forearm tendon repair     WRIST SURGERY      Family History  Problem Relation Age of Onset   Diabetes Mother    Hypertension Mother    Diabetes Father    Depression Brother    Diabetes Maternal Grandmother    Hypertension Maternal Grandmother    Diabetes Maternal Grandfather    Hypertension Maternal Grandfather    Colon cancer Neg Hx     No Known Allergies  Current Outpatient Medications  on File Prior to Visit  Medication Sig Dispense Refill   FARXIGA 10 MG TABS tablet TAKE 1 TABLET BY MOUTH ONCE DAILY BEFORE BREAKFAST (REPLACES  5  MG  DOSE) 270 tablet 0   glipiZIDE (GLUCOTROL XL) 10 MG 24 hr tablet TAKE 1 TABLET BY MOUTH IN THE MORNING AND IN THE EVENING 180 tablet 0   hydrochlorothiazide (HYDRODIURIL) 25 MG tablet Take 1 tablet (25 mg total) by mouth daily. 90 tablet 0   ibuprofen (ADVIL) 600 MG tablet Take 1 tablet (600 mg total) by mouth every 6 (six) hours as needed. 90 tablet 0   lisinopril (ZESTRIL) 30 MG tablet Take 1 tablet (30 mg total) by mouth daily. 90 tablet 0   metFORMIN (GLUCOPHAGE) 1000 MG tablet TAKE 1 TABLET BY MOUTH TWICE DAILY WITH MEALS 180 tablet 0   pravastatin (PRAVACHOL) 20 MG tablet Take 1 tablet by mouth once daily 90 tablet 0   tadalafil (CIALIS) 20 MG tablet Take 1 tablet (20 mg total) by mouth every other day as needed for erectile dysfunction. 30  tablet 6   No current facility-administered medications on file prior to visit.    BP 130/70   Pulse 70   Temp 97.7 F (36.5 C) (Oral)   Ht 5\' 9"  (1.753 m)   Wt 185 lb (83.9 kg)   SpO2 97%   BMI 27.32 kg/m       Objective:   Physical Exam Vitals and nursing note reviewed.  Constitutional:      Appearance: Normal appearance. He is obese.  Cardiovascular:     Rate and Rhythm: Normal rate and regular rhythm.     Pulses: Normal pulses.     Heart sounds: Normal heart sounds.  Pulmonary:     Effort: Pulmonary effort is normal.     Breath sounds: Normal breath sounds.  Skin:    General: Skin is warm and dry.  Neurological:     General: No focal deficit present.     Mental Status: He is alert and oriented to person, place, and time.  Psychiatric:        Mood and Affect: Mood normal.        Behavior: Behavior normal.        Thought Content: Thought content normal.        Judgment: Judgment normal.       Assessment & Plan:  1. Diabetes mellitus treated with oral medication (HCC) (Primary)  - POC HgB A1c- 7.5 Not at goal.  - He is willing to try [facility]. Sample give of 2.5 mg weekly. LOT [id number] Exp 06/18/2025  - Cut back on alcohol during the weekend.  - Follow up in one month  - FARXIGA 10 MG TABS tablet; TAKE 1 TABLET BY MOUTH ONCE DAILY BEFORE BREAKFAST (REPLACES  5  MG  DOSE)  Dispense: 270 tablet; Refill: 0 - glipiZIDE (GLUCOTROL XL) 10 MG 24 hr tablet; TAKE 1 TABLET BY MOUTH IN THE MORNING AND IN THE EVENING  Dispense: 180 tablet; Refill: 0  2. Essential hypertension - Well controlled. No change in medication   Shirline Frees, NP

## 2024-03-06 ENCOUNTER — Other Ambulatory Visit: Payer: Self-pay | Admitting: Adult Health

## 2024-03-06 DIAGNOSIS — N529 Male erectile dysfunction, unspecified: Secondary | ICD-10-CM

## 2024-03-19 ENCOUNTER — Ambulatory Visit: Admitting: Adult Health

## 2024-03-19 VITALS — BP 110/60 | HR 85 | Temp 97.9°F | Ht 69.0 in | Wt 180.0 lb

## 2024-03-19 DIAGNOSIS — Z7985 Long-term (current) use of injectable non-insulin antidiabetic drugs: Secondary | ICD-10-CM | POA: Diagnosis not present

## 2024-03-19 DIAGNOSIS — Z7984 Long term (current) use of oral hypoglycemic drugs: Secondary | ICD-10-CM

## 2024-03-19 DIAGNOSIS — I10 Essential (primary) hypertension: Secondary | ICD-10-CM

## 2024-03-19 DIAGNOSIS — E119 Type 2 diabetes mellitus without complications: Secondary | ICD-10-CM | POA: Diagnosis not present

## 2024-03-19 MED ORDER — BLOOD GLUCOSE MONITORING SUPPL DEVI
1.0000 | Freq: Three times a day (TID) | 0 refills | Status: AC
Start: 2024-03-19 — End: ?

## 2024-03-19 MED ORDER — LANCETS MISC. MISC: 1.0000 | Freq: Three times a day (TID) | 3 refills | Status: AC

## 2024-03-19 MED ORDER — BLOOD GLUCOSE TEST VI STRP: 1.0000 | ORAL_STRIP | Freq: Three times a day (TID) | 1 refills | Status: AC

## 2024-03-19 MED ORDER — TIRZEPATIDE 5 MG/0.5ML ~~LOC~~ SOAJ: 5.0000 mg | SUBCUTANEOUS | 0 refills | Status: DC

## 2024-03-19 NOTE — Progress Notes (Signed)
 Subjective:    Patient ID: Ronald Pierce , male    DOB: 1961-04-25, 63 y.o.   MRN: [id number]  HPI  63 year old male who  has a past medical history of Diabetes mellitus without complication (HCC), Drug abuse (HCC), Hyperlipidemia, and Hypertension.  He presents to the office today for follow-up regarding diabetes and hypertension.    During his last visit a month ago his A1c was 7.5 this time he was managed with glipizide  10 mg ER twice daily, metformin  1000 mg twice daily, and Farxiga  10 mg daily.  He reported that he had a "sweet tooth" and was drinking more beer on the weekends than he normally does, usually a 12 pack in the weekend.  He was given a sample of Mounjaro  2.5 mg to try. He reports that he has not had any side effects of Mounjaro  and has tolerated this medication well. He has noticed that he will get a little " jittery" in the afternoon.   Wt Readings from Last 3 Encounters:  03/19/24 180 lb (81.6 kg)  02/20/24 185 lb (83.9 kg)  10/31/23 185 lb 9.6 oz (84.2 kg)   His blood pressure is controlled with lisinopril  30 mg daily and HCTZ 25 mg daily.  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   Hyperlipidemia    Hypertension     Social History   Socioeconomic History   Marital status: Married    Spouse name: Not on file   Number of children: Not on file   Years of education: Not on file   Highest education level: Not on file  Occupational History   Not on file  Tobacco Use   Smoking status: Former    Current packs/day: 0.00    Types: Cigarettes    Quit date: 11/21/2012    Years since quitting: 11.3   Smokeless tobacco: Never  Substance and  Sexual Activity   Alcohol use: Yes    Alcohol/week: 21.0 standard drinks of alcohol    Types: 21 Shots of liquor per week    Comment: beer on weekends   Drug use: No   Sexual activity: Not on file  Other Topics Concern   Not on file  Social History Narrative   High school education    Works at [employer]   Married   One biological child    Social Drivers of Corporate investment banker Strain: Not on file  Food Insecurity: Not on file  Transportation Needs: Not on file  Physical Activity: Not on file  Stress: Not on file  Social Connections: Not on file  Intimate Partner Violence: Not on file    Past Surgical History:  Procedure Laterality Date   left forearm tendon repair     WRIST SURGERY      Family History  Problem Relation Age of Onset   Diabetes Mother    Hypertension Mother    Diabetes Father    Depression Brother    Diabetes Maternal Grandmother    Hypertension Maternal Grandmother    Diabetes Maternal Grandfather    Hypertension Maternal Grandfather    Colon cancer Neg Hx  No Known Allergies  Current Outpatient Medications on File Prior to Visit  Medication Sig Dispense Refill   FARXIGA  10 MG TABS tablet TAKE 1 TABLET BY MOUTH ONCE DAILY BEFORE BREAKFAST (REPLACES  5  MG  DOSE) 270 tablet 0   glipiZIDE  (GLUCOTROL  XL) 10 MG 24 hr tablet TAKE 1 TABLET BY MOUTH IN THE MORNING AND IN THE EVENING 180 tablet 0   hydrochlorothiazide  (HYDRODIURIL ) 25 MG tablet Take 1 tablet (25 mg total) by mouth daily. 90 tablet 0   ibuprofen  (ADVIL ) 600 MG tablet Take 1 tablet (600 mg total) by mouth every 6 (six) hours as needed. 90 tablet 0   lisinopril  (ZESTRIL ) 30 MG tablet Take 1 tablet (30 mg total) by mouth daily. 90 tablet 0   metFORMIN  (GLUCOPHAGE ) 1000 MG tablet TAKE 1 TABLET BY MOUTH TWICE DAILY WITH MEALS 180 tablet 0   pravastatin  (PRAVACHOL ) 20 MG tablet Take 1 tablet by mouth once daily 90 tablet 0   tadalafil  (CIALIS ) 20 MG tablet TAKE 1 TABLET BY  MOUTH EVERY OTHER DAY AS NEEDED FOR ERECTILE DYSFUNCTION 15 tablet 0   No current facility-administered medications on file prior to visit.    BP 110/60   Pulse 85   Temp 97.9 F (36.6 C) (Oral)   Ht 5\' 9"  (1.753 m)   Wt 180 lb (81.6 kg)   SpO2 97%   BMI 26.58 kg/m       Objective:   Physical Exam Vitals and nursing note reviewed.  Constitutional:      Appearance: Normal appearance.  Cardiovascular:     Rate and Rhythm: Normal rate and regular rhythm.     Pulses: Normal pulses.     Heart sounds: Normal heart sounds.  Pulmonary:     Effort: Pulmonary effort is normal.     Breath sounds: Normal breath sounds.  Musculoskeletal:        General: Normal range of motion.  Skin:    General: Skin is warm and dry.  Neurological:     General: No focal deficit present.     Mental Status: He is alert and oriented to person, place, and time.  Psychiatric:        Mood and Affect: Mood normal.        Behavior: Behavior normal.        Thought Content: Thought content normal.        Judgment: Judgment normal.       Assessment & Plan:  1. Diabetes mellitus treated with oral medication (HCC) (Primary) - Will keep him on his oral medications for the time being. Encouraged small meals and oral hydration throughout the day. Will send in glucometer to check BS at home.  - Follow up in 30 days  - Blood Glucose Monitoring Suppl DEVI; 1 each by Does not apply route in the morning, at noon, and at bedtime. May substitute to any manufacturer covered by patient's insurance.  Dispense: 1 each; Refill: 0 - Glucose Blood (BLOOD GLUCOSE TEST STRIPS) STRP; 1 each by In Vitro route in the morning, at noon, and at bedtime. May substitute to any manufacturer covered by patient's insurance.  Dispense: 300 strip; Refill: 1 - Lancets Misc. MISC; 1 each by Does not apply route in the morning, at noon, and at bedtime. May substitute to any manufacturer covered by patient's insurance.  Dispense: 300 each;  Refill: 3  2. Long-term current use of injectable noninsulin antidiabetic medication  - tirzepatide (MOUNJARO) 5 MG/0.5ML Pen; Inject 5 mg  into the skin once a week.  Dispense: 2 mL; Refill: 0  3. Essential hypertension - No change in medication   Alto Atta, NP

## 2024-04-01 ENCOUNTER — Other Ambulatory Visit: Payer: Self-pay | Admitting: Adult Health

## 2024-04-01 DIAGNOSIS — E7800 Pure hypercholesterolemia, unspecified: Secondary | ICD-10-CM

## 2024-04-16 ENCOUNTER — Ambulatory Visit (INDEPENDENT_AMBULATORY_CARE_PROVIDER_SITE_OTHER): Admitting: Adult Health

## 2024-04-16 ENCOUNTER — Encounter: Payer: Self-pay | Admitting: Adult Health

## 2024-04-16 VITALS — BP 110/70 | HR 96 | Temp 98.1°F | Ht 69.0 in | Wt 176.0 lb

## 2024-04-16 DIAGNOSIS — Z7985 Long-term (current) use of injectable non-insulin antidiabetic drugs: Secondary | ICD-10-CM

## 2024-04-16 DIAGNOSIS — I10 Essential (primary) hypertension: Secondary | ICD-10-CM | POA: Diagnosis not present

## 2024-04-16 DIAGNOSIS — E119 Type 2 diabetes mellitus without complications: Secondary | ICD-10-CM

## 2024-04-16 DIAGNOSIS — Z7984 Long term (current) use of oral hypoglycemic drugs: Secondary | ICD-10-CM

## 2024-04-16 LAB — GLUCOSE, POCT (MANUAL RESULT ENTRY): POC Glucose: 130 mg/dL — AB (ref 70–99)

## 2024-04-16 NOTE — Progress Notes (Signed)
 Subjective:    Patient ID: Ronald Pierce , male    DOB: 03-09-1961, 63 y.o.   MRN: [id number]  HPI 63 year old male who  has a past medical history of Diabetes mellitus without complication (HCC), Drug abuse (HCC), Hyperlipidemia, and Hypertension.  He presents to the office today for follow-up regarding diabetes and hypertension.    DM type 2 - He is currently maintained on glipizide  10 mg ER BID, Metformin  1000 mg BID, Farxiga  10 mg daily and is in the process of titration of Mounjaro and reports that he would like to come off Mounjaro as it " makes him " feel like a zombie for three days and it has changed the way food tastes".  He never picked up his glucometer.  Lab Results  Component Value Date   HGBA1C 7.5 (A) 02/20/2024   HGBA1C 7.6 (A) 10/31/2023   HGBA1C 7.1 (A) 07/25/2023   Wt Readings from Last 3 Encounters:  04/16/24 176 lb (79.8 kg)  03/19/24 180 lb (81.6 kg)  02/20/24 185 lb (83.9 kg)   His blood pressure is controlled with lisinopril  30 mg daily and HCTZ 25 mg daily.  He denies dizziness, lightheadedness, chest pain, shortness of breath, or syncopal episodes  BP Readings from Last 3 Encounters:  04/16/24 110/70  03/19/24 110/60  02/20/24 130/70   Review of Systems  Constitutional: Negative.   HENT: Negative.    Eyes: Negative.   Respiratory: Negative.    Cardiovascular: Negative.   Gastrointestinal: Negative.   Endocrine: Negative.   Genitourinary: Negative.   Musculoskeletal: Negative.   Skin: Negative.   Allergic/Immunologic: Negative.   Neurological: Negative.   Hematological: Negative.   Psychiatric/Behavioral: Negative.    All other systems reviewed and are negative.  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   Hyperlipidemia    Hypertension     Social History   Socioeconomic History   Marital status: Married    Spouse name: Not on  file   Number of children: Not on file   Years of education: Not on file   Highest education level: Not on file  Occupational History   Not on file  Tobacco Use   Smoking status: Former    Current packs/day: 0.00    Types: Cigarettes    Quit date: 11/21/2012    Years since quitting: 11.4   Smokeless tobacco: Never  Substance and Sexual Activity   Alcohol use: Yes    Alcohol/week: 21.0 standard drinks of alcohol    Types: 21 Shots of liquor per week    Comment: beer on weekends   Drug use: No   Sexual activity: Not on file  Other Topics Concern   Not on file  Social History Narrative   High school education    Works at [employer]   Married   One biological child    Social Drivers of Corporate investment banker Strain: Not on file  Food Insecurity: Not on file  Transportation Needs: Not on file  Physical Activity: Not on file  Stress: Not on file  Social Connections: Not on file  Intimate Partner Violence: Not on file    Past Surgical History:  Procedure Laterality Date   left forearm tendon repair     WRIST SURGERY      Family History  Problem Relation Age of Onset  Diabetes Mother    Hypertension Mother    Diabetes Father    Depression Brother    Diabetes Maternal Grandmother    Hypertension Maternal Grandmother    Diabetes Maternal Grandfather    Hypertension Maternal Grandfather    Colon cancer Neg Hx     No Known Allergies  Current Outpatient Medications on File Prior to Visit  Medication Sig Dispense Refill   Blood Glucose Monitoring Suppl DEVI 1 each by Does not apply route in the morning, at noon, and at bedtime. May substitute to any manufacturer covered by patient's insurance. 1 each 0   FARXIGA  10 MG TABS tablet TAKE 1 TABLET BY MOUTH ONCE DAILY BEFORE BREAKFAST (REPLACES  5  MG  DOSE) 270 tablet 0   glipiZIDE  (GLUCOTROL  XL) 10 MG 24 hr tablet TAKE 1 TABLET BY MOUTH IN THE MORNING AND IN THE EVENING 180 tablet 0   Glucose Blood (BLOOD  GLUCOSE TEST STRIPS) STRP 1 each by In Vitro route in the morning, at noon, and at bedtime. May substitute to any manufacturer covered by patient's insurance. 300 strip 1   hydrochlorothiazide  (HYDRODIURIL ) 25 MG tablet Take 1 tablet (25 mg total) by mouth daily. 90 tablet 0   ibuprofen  (ADVIL ) 600 MG tablet Take 1 tablet (600 mg total) by mouth every 6 (six) hours as needed. 90 tablet 0   Lancets Misc. MISC 1 each by Does not apply route in the morning, at noon, and at bedtime. May substitute to any manufacturer covered by patient's insurance. 300 each 3   lisinopril  (ZESTRIL ) 30 MG tablet Take 1 tablet (30 mg total) by mouth daily. 90 tablet 0   metFORMIN  (GLUCOPHAGE ) 1000 MG tablet TAKE 1 TABLET BY MOUTH TWICE DAILY WITH MEALS 180 tablet 0   pravastatin  (PRAVACHOL ) 20 MG tablet Take 1 tablet by mouth once daily 90 tablet 0   tadalafil  (CIALIS ) 20 MG tablet TAKE 1 TABLET BY MOUTH EVERY OTHER DAY AS NEEDED FOR ERECTILE DYSFUNCTION 15 tablet 0   tirzepatide (MOUNJARO) 5 MG/0.5ML Pen Inject 5 mg into the skin once a week. 2 mL 0   No current facility-administered medications on file prior to visit.    BP 110/70   Pulse 96   Temp 98.1 F (36.7 C) (Oral)   Ht 5\' 9"  (1.753 m)   Wt 176 lb (79.8 kg)   SpO2 96%   BMI 25.99 kg/m       Objective:   Physical Exam Constitutional:      Appearance: Normal appearance. He is obese.  Cardiovascular:     Rate and Rhythm: Normal rate and regular rhythm.     Pulses: Normal pulses.     Heart sounds: Normal heart sounds.  Pulmonary:     Effort: Pulmonary effort is normal.     Breath sounds: Normal breath sounds.  Musculoskeletal:        General: Normal range of motion.  Skin:    General: Skin is warm and dry.  Neurological:     General: No focal deficit present.     Mental Status: He is alert and oriented to person, place, and time.  Psychiatric:        Mood and Affect: Mood normal.        Behavior: Behavior normal.        Thought Content:  Thought content normal.        Judgment: Judgment normal.        Assessment & Plan:  1. Diabetes mellitus treated  with oral medication (HCC) (Primary) - Continue with Metformin , Glipizide  and Farxiga .  - POC Glucose (CBG)- 130  2. Long-term current use of injectable noninsulin antidiabetic medication - I am wondering if his blood sugars were dropping too much at the beginning of his injection cycle. We discussed stopping Glipizide  but he is wants to come off Mounjaro. Advised to follow up in 1 month for A1c and I expect this to be lower but likely will need to start insulin therapy in the future to which he replied " I will just get a second opinion then because I do not want to start insulin either".  His A1c has not been at goal for >3 years. He will need to work on his diet and start exercising.   3. Essential hypertension - Controlled. No change in medication   Alto Atta, NP

## 2024-04-16 NOTE — Patient Instructions (Signed)
 Health Maintenance Due  Topic Date Due   Pneumonia Vaccine 31+ Years old (3 of 3 - PCV20 or PCV21) 12/25/2019   COVID-19 Vaccine (3 - 2024-25 season) 07/28/2023   Diabetic kidney evaluation - Urine ACR  04/17/2024       02/20/2024    7:55 AM 10/03/2023    7:28 AM 07/25/2023    7:30 AM  Depression screen PHQ 2/9  Decreased Interest 1 1 3   Down, Depressed, Hopeless 2 1 1   PHQ - 2 Score 3 2 4   Altered sleeping 2 1 2   Tired, decreased energy 1 2 3   Change in appetite 0 0 0  Feeling bad or failure about yourself  2 0 0  Trouble concentrating 0 1 0  Moving slowly or fidgety/restless 0 1 1  Suicidal thoughts 0 0 0  PHQ-9 Score 8 7 10   Difficult doing work/chores Not difficult at all Not difficult at all Somewhat difficult

## 2024-04-17 ENCOUNTER — Other Ambulatory Visit: Payer: Self-pay | Admitting: Adult Health

## 2024-04-17 DIAGNOSIS — I10 Essential (primary) hypertension: Secondary | ICD-10-CM

## 2024-04-22 ENCOUNTER — Other Ambulatory Visit: Payer: Self-pay | Admitting: Adult Health

## 2024-04-22 DIAGNOSIS — I10 Essential (primary) hypertension: Secondary | ICD-10-CM

## 2024-04-22 DIAGNOSIS — E119 Type 2 diabetes mellitus without complications: Secondary | ICD-10-CM

## 2024-05-05 ENCOUNTER — Other Ambulatory Visit: Payer: Self-pay | Admitting: Adult Health

## 2024-05-05 DIAGNOSIS — N529 Male erectile dysfunction, unspecified: Secondary | ICD-10-CM

## 2024-05-21 ENCOUNTER — Ambulatory Visit: Payer: Self-pay | Admitting: Adult Health

## 2024-05-21 ENCOUNTER — Encounter: Payer: Self-pay | Admitting: Adult Health

## 2024-05-21 ENCOUNTER — Other Ambulatory Visit: Payer: Self-pay | Admitting: Adult Health

## 2024-05-21 ENCOUNTER — Ambulatory Visit: Admitting: Adult Health

## 2024-05-21 VITALS — BP 110/70 | HR 69 | Temp 98.1°F | Ht 69.0 in | Wt 181.0 lb

## 2024-05-21 DIAGNOSIS — Z125 Encounter for screening for malignant neoplasm of prostate: Secondary | ICD-10-CM

## 2024-05-21 DIAGNOSIS — Z7984 Long term (current) use of oral hypoglycemic drugs: Secondary | ICD-10-CM

## 2024-05-21 DIAGNOSIS — Z0000 Encounter for general adult medical examination without abnormal findings: Secondary | ICD-10-CM

## 2024-05-21 DIAGNOSIS — E119 Type 2 diabetes mellitus without complications: Secondary | ICD-10-CM | POA: Diagnosis not present

## 2024-05-21 DIAGNOSIS — N529 Male erectile dysfunction, unspecified: Secondary | ICD-10-CM

## 2024-05-21 DIAGNOSIS — E7800 Pure hypercholesterolemia, unspecified: Secondary | ICD-10-CM

## 2024-05-21 DIAGNOSIS — I10 Essential (primary) hypertension: Secondary | ICD-10-CM

## 2024-05-21 DIAGNOSIS — Z23 Encounter for immunization: Secondary | ICD-10-CM

## 2024-05-21 LAB — COMPREHENSIVE METABOLIC PANEL WITH GFR
ALT: 16 U/L (ref 0–53)
AST: 16 U/L (ref 0–37)
Albumin: 4.4 g/dL (ref 3.5–5.2)
Alkaline Phosphatase: 37 U/L — ABNORMAL LOW (ref 39–117)
BUN: 22 mg/dL (ref 6–23)
CO2: 26 meq/L (ref 19–32)
Calcium: 9.9 mg/dL (ref 8.4–10.5)
Chloride: 101 meq/L (ref 96–112)
Creatinine, Ser: 1.11 mg/dL (ref 0.40–1.50)
GFR: 69.86 mL/min (ref >60.00)
Glucose, Bld: 138 mg/dL — ABNORMAL HIGH (ref 70–99)
Potassium: 4.4 meq/L (ref 3.5–5.1)
Sodium: 136 meq/L (ref 135–145)
Total Bilirubin: 0.3 mg/dL (ref 0.2–1.2)
Total Protein: 7.7 g/dL (ref 6.0–8.3)

## 2024-05-21 LAB — LIPID PANEL
Cholesterol: 170 mg/dL (ref 0–200)
HDL: 44.9 mg/dL (ref >39.00)
LDL Cholesterol: 86 mg/dL (ref 0–99)
NonHDL: 125.05
Total CHOL/HDL Ratio: 4
Triglycerides: 193 mg/dL — ABNORMAL HIGH (ref 0.0–149.0)
VLDL: 38.6 mg/dL (ref 0.0–40.0)

## 2024-05-21 LAB — TSH: TSH: 3 u[IU]/mL (ref 0.35–5.50)

## 2024-05-21 LAB — MICROALBUMIN / CREATININE URINE RATIO
Creatinine,U: 58.7 mg/dL
Microalb Creat Ratio: UNDETERMINED mg/g (ref 0.0–30.0)
Microalb, Ur: <0.7 mg/dL

## 2024-05-21 LAB — CBC
HCT: 39.6 % (ref 39.0–52.0)
Hemoglobin: 13 g/dL (ref 13.0–17.0)
MCHC: 32.9 g/dL (ref 30.0–36.0)
MCV: 83.3 fl (ref 78.0–100.0)
Platelets: 387 10*3/uL (ref 150.0–400.0)
RBC: 4.76 Mil/uL (ref 4.22–5.81)
RDW: 14.3 % (ref 11.5–15.5)
WBC: 6.5 10*3/uL (ref 4.0–10.5)

## 2024-05-21 LAB — PSA: PSA: 0.81 ng/mL (ref 0.10–4.00)

## 2024-05-21 LAB — HEMOGLOBIN A1C: Hgb A1c MFr Bld: 7.5 % — ABNORMAL HIGH (ref 4.6–6.5)

## 2024-05-21 MED ORDER — FARXIGA 10 MG PO TABS: ORAL_TABLET | ORAL | 1 refills | Status: DC

## 2024-05-21 MED ORDER — TIRZEPATIDE 2.5 MG/0.5ML ~~LOC~~ SOAJ: 2.5000 mg | SUBCUTANEOUS | 0 refills | Status: DC

## 2024-05-21 MED ORDER — GLIPIZIDE ER 10 MG PO TB24: ORAL_TABLET | ORAL | 1 refills | Status: DC

## 2024-05-21 NOTE — Patient Instructions (Signed)
 It was great seeing you today   We will follow up with you regarding your lab work   Please let me know if you need anything

## 2024-05-21 NOTE — Progress Notes (Signed)
 Subjective:    Patient ID: Daymen Hassebrock Curfman , male    DOB: 1961-01-19, 63 y.o.   MRN: [id number]  HPI Patient presents for yearly preventative medicine examination. He is a pleasant 63 year old male who  has a past medical history of Diabetes mellitus without complication (HCC), Drug abuse (HCC), Hyperlipidemia, and Hypertension.  DM type 2 - He is currently maintained on Glipizide  10 mg ER BID, Metformin  1000 mg BID, Farxiga  10 mg daily. During his last visit a month ago he wanted to come off Mounjaro  5mg   as he did not like the way it made him feel for a few days after taking it.SABRA He is willing to try a slower titration  Lab Results  Component Value Date   HGBA1C 7.5 (A) 02/20/2024   HGBA1C 7.6 (A) 10/31/2023   HGBA1C 7.1 (A) 07/25/2023   Wt Readings from Last 3 Encounters:  05/21/24 181 lb (82.1 kg)  04/16/24 176 lb (79.8 kg)  03/19/24 180 lb (81.6 kg)   HTN - His blood pressure is controlled with lisinopril  30 mg daily and HCTZ 25 mg daily.  He denies dizziness, lightheadedness, chest pain, shortness of breath, or syncopal episodes BP Readings from Last 3 Encounters:  05/21/24 110/70  04/16/24 110/70  03/19/24 110/60   HLD - managed with pravastatin  20 mg daily. He denies myalgia or fatigue   Lab Results  Component Value Date   CHOL 143 10/12/2022   HDL 47.30 10/12/2022   LDLCALC 72 10/12/2022   LDLDIRECT 85.0 09/09/2013   TRIG 121.0 10/12/2022   CHOLHDL 3 10/12/2022   ED- uses Cialis  20 mg PRN   All immunizations and health maintenance protocols were reviewed with the patient and needed orders were placed.  Appropriate screening laboratory values were ordered for the patient including screening of hyperlipidemia, renal function and hepatic function. If indicated by BPH, a PSA was ordered.  Medication reconciliation,  past medical history, social history, problem list and allergies were reviewed in detail with the patient  Goals were established with regard to weight  loss, exercise, and  diet in compliance with medications Wt Readings from Last 3 Encounters:  05/21/24 181 lb (82.1 kg)  04/16/24 176 lb (79.8 kg)  03/19/24 180 lb (81.6 kg)    Review of Systems  Constitutional: Negative.   HENT: Negative.    Eyes: Negative.   Respiratory: Negative.    Cardiovascular: Negative.   Gastrointestinal: Negative.   Endocrine: Negative.   Genitourinary: Negative.   Musculoskeletal: Negative.   Skin: Negative.   Allergic/Immunologic: Negative.   Neurological: Negative.   Hematological: Negative.   Psychiatric/Behavioral: Negative.    All other systems reviewed and are negative.    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   Hyperlipidemia    Hypertension     Social History   Socioeconomic History   Marital status: Married    Spouse name: Not on file   Number of children: Not on file   Years of education: Not on file   Highest education level: Not on file  Occupational History   Not on file  Tobacco Use   Smoking status: Former    Current packs/day: 0.00    Types: Cigarettes    Quit date: 11/21/2012    Years since quitting: 11.5   Smokeless tobacco: Never  Substance and Sexual Activity  Alcohol use: Yes    Alcohol/week: 21.0 standard drinks of alcohol    Types: 21 Shots of liquor per week    Comment: beer on weekends   Drug use: No   Sexual activity: Not on file  Other Topics Concern   Not on file  Social History Narrative   High school education    Works at [employer]   Married   One biological child    Social Drivers of Corporate investment banker Strain: Not on file  Food Insecurity: Not on file  Transportation Needs: Not on file  Physical Activity: Not on file  Stress: Not on file  Social Connections: Not on file  Intimate Partner Violence: Not on file    Past Surgical History:  Procedure Laterality  Date   left forearm tendon repair     WRIST SURGERY      Family History  Problem Relation Age of Onset   Diabetes Mother    Hypertension Mother    Diabetes Father    Depression Brother    Diabetes Maternal Grandmother    Hypertension Maternal Grandmother    Diabetes Maternal Grandfather    Hypertension Maternal Grandfather    Colon cancer Neg Hx     No Known Allergies  Current Outpatient Medications on File Prior to Visit  Medication Sig Dispense Refill   Blood Glucose Monitoring Suppl DEVI 1 each by Does not apply route in the morning, at noon, and at bedtime. May substitute to any manufacturer covered by patient's insurance. 1 each 0   hydrochlorothiazide  (HYDRODIURIL ) 25 MG tablet Take 1 tablet by mouth once daily 90 tablet 0   ibuprofen  (ADVIL ) 600 MG tablet Take 1 tablet (600 mg total) by mouth every 6 (six) hours as needed. 90 tablet 0   lisinopril  (ZESTRIL ) 30 MG tablet Take 1 tablet by mouth once daily 90 tablet 0   metFORMIN  (GLUCOPHAGE ) 1000 MG tablet TAKE 1 TABLET BY MOUTH TWICE DAILY WITH MEALS 180 tablet 0   pravastatin  (PRAVACHOL ) 20 MG tablet Take 1 tablet by mouth once daily 90 tablet 0   tadalafil  (CIALIS ) 20 MG tablet TAKE 1 TABLET BY MOUTH EVERY OTHER DAY AS NEEDED FOR ERECTILE DYSFUNCTION 15 tablet 3   No current facility-administered medications on file prior to visit.    BP 110/70   Pulse 69   Temp 98.1 F (36.7 C) (Oral)   Ht 5' 9 (1.753 m)   Wt 181 lb (82.1 kg)   SpO2 95%   BMI 26.73 kg/m       Objective:   Physical Exam Vitals and nursing note reviewed.  Constitutional:      Appearance: Normal appearance.   Cardiovascular:     Rate and Rhythm: Normal rate and regular rhythm.     Pulses: Normal pulses.     Heart sounds: Normal heart sounds.  Pulmonary:     Effort: Pulmonary effort is normal.     Breath sounds: Normal breath sounds.   Musculoskeletal:        General: Normal range of motion.   Skin:    General: Skin is warm and  dry.     Capillary Refill: Capillary refill takes less than 2 seconds.   Neurological:     General: No focal deficit present.     Mental Status: He is alert and oriented to person, place, and time.   Psychiatric:        Mood and Affect: Mood normal.  Behavior: Behavior normal.        Thought Content: Thought content normal.        Judgment: Judgment normal.        Assessment & Plan:   1. Routine general medical examination at a health care facility (Primary) Today patient counseled on age appropriate routine health concerns for screening and prevention, each reviewed and up to date or declined. Immunizations reviewed and up to date or declined. Labs ordered and reviewed. Risk factors for depression reviewed and negative. Hearing function and visual acuity are intact. ADLs screened and addressed as needed. Functional ability and level of safety reviewed and appropriate. Education, counseling and referrals performed based on assessed risks today. Patient provided with a copy of personalized plan for preventive services. - Work on exercise and eating healthy  - Follow up in one year or sooner if needed  2. Diabetes mellitus treated with oral medication (HCC) - Consider restarting Mounjaro  2.5 mg  - Lipid panel; Future - TSH; Future - CBC; Future - Comprehensive metabolic panel with GFR; Future - Microalbumin/Creatinine Ratio, Urine; Future - Hemoglobin A1c; Future - FARXIGA  10 MG TABS tablet; TAKE 1 TABLET BY MOUTH ONCE DAILY BEFORE BREAKFAST (REPLACES  5  MG  DOSE)  Dispense: 270 tablet; Refill: 1 - glipiZIDE  (GLUCOTROL  XL) 10 MG 24 hr tablet; TAKE 1 TABLET BY MOUTH IN THE MORNING AND IN THE EVENING  Dispense: 180 tablet; Refill: 1  3. Essential hypertension - Well controlled. No change in medication  - Lipid panel; Future - TSH; Future - CBC; Future - Comprehensive metabolic panel with GFR; Future  4. HYPERCHOLESTEROLEMIA - Consider increase in statin  - Lipid panel;  Future - TSH; Future - CBC; Future - Comprehensive metabolic panel with GFR; Future  5. Erectile dysfunction, unspecified erectile dysfunction type - Can continue Cialis  as needed - Lipid panel; Future - TSH; Future - CBC; Future - Comprehensive metabolic panel with GFR; Future  6. Prostate cancer screening  - PSA; Future  7. Need for pneumococcal vaccine  - Pneumococcal conjugate vaccine 20-valent (Prevnar 20)  Lucas Exline, NP

## 2024-06-29 ENCOUNTER — Other Ambulatory Visit: Payer: Self-pay | Admitting: Adult Health

## 2024-06-29 DIAGNOSIS — E7800 Pure hypercholesterolemia, unspecified: Secondary | ICD-10-CM

## 2024-07-15 LAB — HM DIABETES EYE EXAM

## 2024-07-16 ENCOUNTER — Other Ambulatory Visit: Payer: Self-pay | Admitting: Adult Health

## 2024-07-16 DIAGNOSIS — I10 Essential (primary) hypertension: Secondary | ICD-10-CM

## 2024-07-16 DIAGNOSIS — E119 Type 2 diabetes mellitus without complications: Secondary | ICD-10-CM

## 2024-07-17 ENCOUNTER — Other Ambulatory Visit: Payer: Self-pay | Admitting: Adult Health

## 2024-07-17 DIAGNOSIS — M1991 Primary osteoarthritis, unspecified site: Secondary | ICD-10-CM

## 2024-07-18 ENCOUNTER — Other Ambulatory Visit: Payer: Self-pay | Admitting: Adult Health

## 2024-07-18 DIAGNOSIS — I10 Essential (primary) hypertension: Secondary | ICD-10-CM

## 2024-08-22 ENCOUNTER — Other Ambulatory Visit: Payer: Self-pay

## 2024-08-22 ENCOUNTER — Emergency Department (HOSPITAL_COMMUNITY)

## 2024-08-22 ENCOUNTER — Emergency Department (HOSPITAL_COMMUNITY)
Admission: EM | Admit: 2024-08-22 | Discharge: 2024-08-22 | Disposition: A | Discharge status: Home / Self Care | Attending: Emergency Medicine | Admitting: Emergency Medicine

## 2024-08-22 ENCOUNTER — Encounter (HOSPITAL_COMMUNITY): Payer: Self-pay | Admitting: Pharmacy Technician

## 2024-08-22 DIAGNOSIS — Z7984 Long term (current) use of oral hypoglycemic drugs: Secondary | ICD-10-CM | POA: Diagnosis not present

## 2024-08-22 DIAGNOSIS — R0781 Pleurodynia: Secondary | ICD-10-CM | POA: Insufficient documentation

## 2024-08-22 DIAGNOSIS — I10 Essential (primary) hypertension: Secondary | ICD-10-CM | POA: Insufficient documentation

## 2024-08-22 DIAGNOSIS — E119 Type 2 diabetes mellitus without complications: Secondary | ICD-10-CM | POA: Insufficient documentation

## 2024-08-22 DIAGNOSIS — R079 Chest pain, unspecified: Secondary | ICD-10-CM

## 2024-08-22 DIAGNOSIS — Z79899 Other long term (current) drug therapy: Secondary | ICD-10-CM | POA: Insufficient documentation

## 2024-08-22 LAB — BASIC METABOLIC PANEL WITH GFR
Anion gap: 11 (ref 5–15)
BUN: 26 mg/dL — ABNORMAL HIGH (ref 8–23)
CO2: 23 mmol/L (ref 22–32)
Calcium: 9.5 mg/dL (ref 8.9–10.3)
Chloride: 102 mmol/L (ref 98–111)
Creatinine, Ser: 1.3 mg/dL — ABNORMAL HIGH (ref 0.61–1.24)
GFR, Estimated: >60 mL/min (ref >60)
Glucose, Bld: 120 mg/dL — ABNORMAL HIGH (ref 70–99)
Potassium: 5 mmol/L (ref 3.5–5.1)
Sodium: 136 mmol/L (ref 135–145)

## 2024-08-22 LAB — HEPATIC FUNCTION PANEL
ALT: 14 U/L (ref 0–44)
AST: 21 U/L (ref 15–41)
Albumin: 3.9 g/dL (ref 3.5–5.0)
Alkaline Phosphatase: 39 U/L (ref 38–126)
Bilirubin, Direct: 0.4 mg/dL — ABNORMAL HIGH (ref 0.0–0.2)
Indirect Bilirubin: 0.4 mg/dL (ref 0.3–0.9)
Total Bilirubin: 0.8 mg/dL (ref 0.0–1.2)
Total Protein: 8 g/dL (ref 6.5–8.1)

## 2024-08-22 LAB — MAGNESIUM: Magnesium: 2 mg/dL (ref 1.7–2.4)

## 2024-08-22 LAB — CBC
HCT: 40.5 % (ref 39.0–52.0)
Hemoglobin: 13 g/dL (ref 13.0–17.0)
MCH: 27.7 pg (ref 26.0–34.0)
MCHC: 32.1 g/dL (ref 30.0–36.0)
MCV: 86.2 fL (ref 80.0–100.0)
Platelets: 384 K/uL (ref 150–400)
RBC: 4.7 MIL/uL (ref 4.22–5.81)
RDW: 13.2 % (ref 11.5–15.5)
WBC: 7.1 K/uL (ref 4.0–10.5)
nRBC: 0 % (ref 0.0–0.2)

## 2024-08-22 LAB — TROPONIN I (HIGH SENSITIVITY)
Troponin I (High Sensitivity): 3 ng/L (ref <18)
Troponin I (High Sensitivity): 4 ng/L (ref <18)

## 2024-08-22 LAB — LIPASE, BLOOD: Lipase: 35 U/L (ref 11–51)

## 2024-08-22 MED ORDER — LACTATED RINGERS IV BOLUS
1000.0000 mL | Freq: Once | INTRAVENOUS | Status: AC
  Administered 2024-08-22: 1000 mL via INTRAVENOUS

## 2024-08-22 MED ORDER — IOHEXOL 350 MG/ML SOLN
75.0000 mL | Freq: Once | INTRAVENOUS | Status: AC | PRN
  Administered 2024-08-22: 75 mL via INTRAVENOUS

## 2024-08-22 NOTE — ED Triage Notes (Signed)
 PT c/o chest pain and sob for several days.  L sided chest pain and sob increases with inspiration.

## 2024-08-22 NOTE — ED Notes (Signed)
 CCMD called for pt monitoring.

## 2024-08-22 NOTE — ED Provider Triage Note (Signed)
 Emergency Medicine Provider Triage Evaluation Note  Ronald Pierce  , a 63 y.o. male  was evaluated in triage.  Pt complains of right-sided chest discomfort which increases with deep inspiration, does not radiate, has been present over the last 4 days.  Denies any exertional dyspnea.  Review of Systems  Positive: As above Negative:   Physical Exam  BP (!) 142/86 (BP Location: Left Arm)   Pulse 82   Temp 97.8 F (36.6 C) (Oral)   Resp 19   SpO2 100%  Gen:   Awake, no distress   Resp:  Normal effort  MSK:   Moves extremities without difficulty  Other:    Medical Decision Making  Medically screening exam initiated at 12:55 PM.  Appropriate orders placed.  Ronald Pierce  was informed that the remainder of the evaluation will be completed by another provider, this initial triage assessment does not replace that evaluation, and the importance of remaining in the ED until their evaluation is complete.  Order set placed for chest discomfort.   Ronald Pierce, [place] 08/22/24 [id number]

## 2024-08-22 NOTE — ED Provider Notes (Signed)
 [place] [facility]ERGENCY DEPARTMENT AT [facility] Provider Note   CSN: [id number] Arrival date & time: 08/22/24  1201     Patient presents with: Chest Pain and Shortness of Breath   Ronald Pierce  is a 63 y.o. male.    Chest Pain Associated symptoms: fatigue and shortness of breath   Shortness of Breath Associated symptoms: chest pain   Patient presents for chest pain.  His medical history includes HTN, DM, HLD.  He is not on a blood thinner.  He has never seen a cardiologist.  Over the past 4 days, he has had intermittent right-sided chest pain.  Chest pain worsens with deep inspiration.  He has not noted a positional or exertional component to his pain.  Currently, he is pain-free unless he takes a deep breath.  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  He has been having recent fatigue.  He denies any other associated symptoms.      Prior to Admission medications   Medication Sig Start Date End Date Taking? Authorizing Provider  Blood Glucose Monitoring Suppl DEVI 1 each by Does not apply route in the morning, at noon, and at bedtime. May substitute to any manufacturer covered by patient's insurance. 03/19/24   Nafziger, Darleene, NP  FARXIGA  10 MG TABS tablet TAKE 1 TABLET BY MOUTH ONCE DAILY BEFORE BREAKFAST (REPLACES  5  MG  DOSE) 05/21/24   Nafziger, Darleene, NP  glipiZIDE  (GLUCOTROL  XL) 10 MG 24 hr tablet TAKE 1 TABLET BY MOUTH IN THE MORNING AND IN THE EVENING 05/21/24   Nafziger, Darleene, NP  hydrochlorothiazide  (HYDRODIURIL ) 25 MG tablet Take 1 tablet by mouth once daily 07/16/24   Nafziger, Darleene, NP  ibuprofen  (ADVIL ) 600 MG tablet TAKE 1 TABLET BY MOUTH EVERY 6 HOURS AS NEEDED 07/17/24   Nafziger, Cory, NP  lisinopril  (ZESTRIL ) 30 MG tablet Take 1 tablet by mouth once daily 07/21/24   Nafziger, Cory, NP  metFORMIN  (GLUCOPHAGE ) 1000 MG tablet TAKE 1 TABLET BY MOUTH TWICE DAILY WITH MEALS 07/16/24   Nafziger, Darleene, NP  pravastatin  (PRAVACHOL ) 20 MG tablet Take 1  tablet by mouth once daily 06/30/24   Nafziger, Cory, NP  tadalafil  (CIALIS ) 20 MG tablet TAKE 1 TABLET BY MOUTH EVERY OTHER DAY AS NEEDED FOR ERECTILE DYSFUNCTION 05/06/24   Nafziger, Darleene, NP  tirzepatide  (MOUNJARO ) 2.5 MG/0.5ML Pen Inject 2.5 mg into the skin once a week. 05/21/24   Nafziger, Cory, NP    Allergies: Patient has no known allergies.    Review of Systems  Constitutional:  Positive for fatigue.  Respiratory:  Positive for shortness of breath.   Cardiovascular:  Positive for chest pain.  All other systems reviewed and are negative.   Updated Vital Signs BP 131/83   Pulse 82   Temp 97.6 F (36.4 C) (Oral)   Resp 13   Ht 5' 9 (1.753 m)   Wt 79.8 kg   SpO2 100%   BMI 25.99 kg/m   Physical Exam Vitals and nursing note reviewed.  Constitutional:      General: He is not in acute distress.    Appearance: He is well-developed. He is not ill-appearing, toxic-appearing or diaphoretic.  HENT:     Head: Normocephalic and atraumatic.  Eyes:     Conjunctiva/sclera: Conjunctivae normal.  Cardiovascular:     Rate and Rhythm: Normal rate and regular rhythm.     Heart sounds: No murmur heard. Pulmonary:     Effort: Pulmonary effort is normal.  No tachypnea or respiratory distress.     Breath sounds: Normal breath sounds.  Chest:     Chest wall: No tenderness.  Abdominal:     Palpations: Abdomen is soft.     Tenderness: There is no abdominal tenderness.  Musculoskeletal:        General: No swelling. Normal range of motion.     Cervical back: Normal range of motion and neck supple.     Right lower leg: No edema.     Left lower leg: No edema.  Skin:    General: Skin is warm and dry.     Coloration: Skin is not cyanotic or pale.  Neurological:     General: No focal deficit present.     Mental Status: He is alert and oriented to person, place, and time.  Psychiatric:        Mood and Affect: Mood normal.        Behavior: Behavior normal.     (all labs ordered are  listed, but only abnormal results are displayed) Labs Reviewed  BASIC METABOLIC PANEL WITH GFR - Abnormal; Notable for the following components:      Result Value   Glucose, Bld 120 (*)    BUN 26 (*)    Creatinine, Ser 1.30 (*)    All other components within normal limits  HEPATIC FUNCTION PANEL - Abnormal; Notable for the following components:   Bilirubin, Direct 0.4 (*)    All other components within normal limits  CBC  MAGNESIUM  LIPASE, BLOOD  TROPONIN I (HIGH SENSITIVITY)  TROPONIN I (HIGH SENSITIVITY)    EKG: EKG Interpretation Date/Time:  Saturday August 22 2024 12:11:26 EDT Ventricular Rate:  71 PR Interval:  208 QRS Duration:  90 QT Interval:  338 QTC Calculation: 367 R Axis:   65  Text Interpretation: Normal sinus rhythm Confirmed by Melvenia Motto ([id number]) on 08/22/2024 1:12:51 PM  Radiology: CT Angio Chest PE W and/or Wo Contrast Result Date: 08/22/2024 CLINICAL DATA:  Short of breath, chest pain, pain with inspiration EXAM: CT ANGIOGRAPHY CHEST WITH CONTRAST TECHNIQUE: Multidetector CT imaging of the chest was performed using the standard protocol during bolus administration of intravenous contrast. Multiplanar CT image reconstructions and MIPs were obtained to evaluate the vascular anatomy. RADIATION DOSE REDUCTION: This exam was performed according to the departmental dose-optimization program which includes automated exposure control, adjustment of the mA and/or kV according to patient size and/or use of iterative reconstruction technique. CONTRAST:  75mL OMNIPAQUE IOHEXOL 350 MG/ML SOLN COMPARISON:  08/22/2024 FINDINGS: Cardiovascular: This is a technically adequate evaluation of the pulmonary vasculature. No filling defects or pulmonary emboli. The heart is unremarkable without pericardial effusion. No evidence of thoracic aortic aneurysm or dissection. Atherosclerosis of the aortic arch. Mediastinum/Nodes: Right axillary lymphadenopathy, largest lymph node measuring 1.5  cm in short axis reference image 26/5. No left axillary, mediastinal, or hilar adenopathy identified. Thyroid  gland, trachea, and esophagus demonstrate no significant findings. Lungs/Pleura: No acute airspace disease, effusion, or pneumothorax. Upper Abdomen: No acute abnormality. Musculoskeletal: No acute or destructive bony abnormalities. Circumscribed homogeneous fat attenuation mass within the right chest wall along the lateral margin of the pectoralis muscle measuring 6.6 x 3.8 x 6.9 cm, compatible with lipoma. Reconstructed images demonstrate no additional findings. Review of the MIP images confirms the above findings. IMPRESSION: 1. No evidence of pulmonary embolus. 2. Right axillary lymphadenopathy, nonspecific. 3. 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 4.  Aortic Atherosclerosis (ICD10-I70.0). Electronically Signed   By: Ozell  Delores M.D.   On: 08/22/2024 17:12   DG Chest 2 View Result Date: 08/22/2024 CLINICAL DATA:  Chest pain and shortness of breath for several days, pain with inspiration EXAM: CHEST - 2 VIEW COMPARISON:  04/21/2021 FINDINGS: The heart size and mediastinal contours are within normal limits. Both lungs are clear. The visualized skeletal structures are unremarkable. IMPRESSION: No active cardiopulmonary disease. Electronically Signed   By: Ozell Delores M.D.   On: 08/22/2024 13:30     Procedures   Medications Ordered in the ED  lactated ringers bolus 1,000 mL (1,000 mLs Intravenous New Bag/Given 08/22/24 1552)  iohexol (OMNIPAQUE) 350 MG/ML injection 75 mL (75 mLs Intravenous Contrast Given 08/22/24 1648)                                    Medical Decision Making Amount and/or Complexity of Data Reviewed Labs: ordered. Radiology: ordered.  Risk Prescription drug management.   This patient presents to the ED for concern of chest pain, this involves an extensive number of treatment options, and is a complaint that carries with it a high risk of complications and  morbidity.  The differential diagnosis includes ACS, PE, pericarditis, costochondritis, other musculoskeletal etiology   Co morbidities / Chronic conditions that complicate the patient evaluation   HTN, DM, HLD   Additional history obtained:  Additional history obtained from EMR External records from outside source obtained and reviewed including N/A   Lab Tests:  I Ordered, and personally interpreted labs.  The pertinent results include: Normal hemoglobin, no leukocytosis, normal electrolytes, normal troponin.  Creatinine is baseline.   Imaging Studies ordered:  I ordered imaging studies including chest x-ray, CTA chest I independently visualized and interpreted imaging which showed axillary lymphadenopathy and right chest wall lipoma.  No other significant findings. I agree with the radiologist interpretation   Cardiac Monitoring: / EKG:  The patient was maintained on a cardiac monitor.  I personally viewed and interpreted the cardiac monitored which showed an underlying rhythm of: Sinus rhythm   Problem List / ED Course / Critical interventions / Medication management  Patient presenting for pleuritic right sided chest pain over the past 4 days.  On arrival in the ED, vital signs are normal.  Patient is well-appearing on exam.  At rest, he is pain-free.  He does endorse ongoing pleuritic discomfort, describes as a squeezing pain in the right side of his lower chest.  Pain is not reproducible.  Current breathing is unlabored.  No cardiac rubs or murmurs are appreciated on auscultation.  Workup was initiated.  Patient's lab work was reassuring.  Initial troponin was normal.  He underwent CTA of chest.  On CTA, he does have a right chest wall lipoma and some right axillary lymphadenopathy.  When questioned about this, patient reports that lipoma has been present for years.  Area is nontender and mobile under the skin, consistent with lipoma.  I do not appreciate any palpable  lymphadenopathy in right axilla.  Patient was advised to treat pain with ibuprofen  and Tylenol .  He currently has a PCP appointment in 2 weeks.  Will order cardiology referral as well for patient to establish care.  At this time, he is stable for discharge. I ordered medication including IV fluids for hydration Reevaluation of the patient after these medicines showed that the patient stayed the same I have reviewed the patients home medicines and have made adjustments as  needed  Social Determinants of Health:  Lives at home with wife     Final diagnoses:  Chest pain, unspecified type    ED Discharge Orders          Ordered    Ambulatory referral to Cardiology       Comments: If you have not heard from the Cardiology office within the next 72 hours please call [phone].   08/22/24 1835               Melvenia Motto, MD 08/22/24 1836

## 2024-08-22 NOTE — Discharge Instructions (Signed)
 Your test results today are reassuring.  A referral was ordered for you to establish care with a cardiologist.  If you do not hear from their office in the next couple days, call the telephone number below to set up that appointment.  Take ibuprofen  and Tylenol  as needed for pain or discomfort.  Return to the emergency department for any new or worsening symptoms of concern.

## 2024-08-25 ENCOUNTER — Ambulatory Visit: Attending: Cardiology | Admitting: Cardiology

## 2024-08-25 ENCOUNTER — Encounter: Payer: Self-pay | Admitting: Cardiology

## 2024-08-25 VITALS — BP 123/73 | HR 87 | Ht 69.0 in | Wt 175.0 lb

## 2024-08-25 DIAGNOSIS — E7800 Pure hypercholesterolemia, unspecified: Secondary | ICD-10-CM | POA: Diagnosis not present

## 2024-08-25 DIAGNOSIS — E119 Type 2 diabetes mellitus without complications: Secondary | ICD-10-CM

## 2024-08-25 DIAGNOSIS — R072 Precordial pain: Secondary | ICD-10-CM

## 2024-08-25 DIAGNOSIS — I10 Essential (primary) hypertension: Secondary | ICD-10-CM | POA: Diagnosis not present

## 2024-08-25 MED ORDER — METOPROLOL TARTRATE 100 MG PO TABS: 100.0000 mg | ORAL_TABLET | ORAL | 0 refills | Status: AC

## 2024-08-25 NOTE — Progress Notes (Signed)
 Cardiology Office Note:  .   Date:  08/25/2024  ID:  Ronald Pierce , DOB Nov 30, 1960, MRN [id number] PCP: Merna Huxley, NP  [facility] Providers Cardiologist:  None     History of Present Illness: .   Ronald Pierce  is a 63 y.o. male Discussed the use of AI scribe software History of Present Illness Ronald Pierce  is a 63 year old male with diabetes who presents with chest pain.  He has been experiencing intermittent chest pain since last Wednesday. Initially, he thought it might be related to gas and took over-the-counter gas relief medication. By Friday, the pain persisted, prompting him to seek medical attention at the emergency room. In the emergency room, he underwent a series of diagnostic tests including x-rays, a CT scan, and an EKG, all of which returned normal results. He was prescribed ibuprofen  600 mg, which provided some relief from the chest pain.  On Sunday, he was able to perform his work duties without significant discomfort, indicating an improvement in his symptoms. He describes the chest pain as being more noticeable when taking deep breaths and sometimes associated with shortness of breath. He also mentions that drinking coffee seems to exacerbate the pain.  His past medical history includes diabetes, as indicated by a hemoglobin A1c of 7.5. Recent lab work showed a creatinine level of 1.3, slightly elevated from a previous level of 1.1. His LDL cholesterol was 86.  He describes himself as a 'workaholic,' working two jobs, which may contribute to his stress levels.      Studies Reviewed: .        Results LABS Troponin: Normal (08/19/2024) Lipase: 35 (08/19/2024) Creatinine: 1.3 (08/19/2024) LDL: 86 (08/19/2024) Hemoglobin A1c: 7.5 (08/19/2024)  RADIOLOGY Chest X-ray: Normal (08/19/2024) Chest CT: Normal (08/19/2024)  DIAGNOSTIC ECG: Normal (08/19/2024) Risk Assessment/Calculations:            Physical Exam:   VS:  BP 123/73   Pulse  87   Ht 5' 9 (1.753 m)   Wt 175 lb (79.4 kg)   SpO2 95%   BMI 25.84 kg/m    Wt Readings from Last 3 Encounters:  08/25/24 175 lb (79.4 kg)  08/22/24 176 lb (79.8 kg)  05/21/24 181 lb (82.1 kg)    GEN: Well nourished, well developed in no acute distress NECK: No JVD; No carotid bruits CARDIAC: RRR, no murmurs, no rubs, no gallops RESPIRATORY:  Clear to auscultation without rales, wheezing or rhonchi  ABDOMEN: Soft, non-tender, non-distended EXTREMITIES:  No edema; No deformity   ASSESSMENT AND PLAN: .    Assessment and Plan Assessment & Plan Chest pain Intermittent chest pain since last Wednesday, exacerbated by deep breathing. Initial emergency room workup, including x-rays, CT scan, and EKG, showed no abnormalities. No ischemia or myocardial infarction indicated by normal EKG and troponins. Differential diagnosis includes gastrointestinal causes such as heartburn, musculoskeletal inflammation, or cardiac issues. Coffee consumption may exacerbate symptoms, suggesting a possible gastrointestinal component. - Order coronary CT scan to evaluate for any narrowing in the coronary arteries. - Schedule coronary CT scan within the next week or two, accommodating his work schedule.  Type 2 diabetes mellitus with hypertension Hemoglobin A1c is 7.5, indicating well-controlled diabetes.  Continue Farxiga  10 mg, glipizide  10 mg, Mounjaro  Continue with hydrochlorothiazide  25 mg a day  Hyperlipidemia LDL cholesterol level is 86, indicating controlled hyperlipidemia.  Continue pravastatin  20 mg.           Dispo: We will follow-up with results of study  Signed, Oneil Parchment, MD

## 2024-08-25 NOTE — Patient Instructions (Signed)
 Medication Instructions:  The current medical regimen is effective;  continue present plan and medications.  *If you need a refill on your cardiac medications before your next appointment, please call your pharmacy*   Testing/Procedures:   Your cardiac CT will be scheduled at:   [facility] [address]  [place], [place] [address] [phone]  Please enter the parking lot using the [address] entrance and use the FREE valet service at the patient drop-off area. Enter the building and check-in with registration on the main floor.  Please follow these instructions carefully (unless otherwise directed):  An IV will be required for this test and Nitroglycerin will be given.  Hold all erectile dysfunction medications at least 3 days (72 hrs) prior to test. (Ie viagra, cialis , sildenafil, tadalafil , etc)   On the Night Before the Test: Be sure to Drink plenty of water. Do not consume any caffeinated/decaffeinated beverages or chocolate 12 hours prior to your test. Do not take any antihistamines 12 hours prior to your test.  On the Day of the Test: Drink plenty of water until 1 hour prior to the test. Do not eat any food 1 hour prior to test. You may take your regular medications prior to the test.  Take metoprolol (Lopressor) two hours prior to test. If you take Furosemide/Hydrochlorothiazide /Spironolactone/Chlorthalidone, please HOLD on the morning of the test. Patients who wear a continuous glucose monitor MUST remove the device prior to scanning.      After the Test: Drink plenty of water. After receiving IV contrast, you may experience a mild flushed feeling. This is normal. On occasion, you may experience a mild rash up to 24 hours after the test. This is not dangerous. If this occurs, you can take Benadryl  25 mg, Zyrtec, Claritin, or Allegra and increase your fluid intake. (Patients taking Tikosyn should avoid Benadryl , and may take Zyrtec,  Claritin, or Allegra) If you experience trouble breathing, this can be serious. If it is severe call [phone] IMMEDIATELY. If it is mild, please call our office.  We will call to schedule your test 2-4 weeks out understanding that some insurance companies will need an authorization prior to the service being performed.   For more information and frequently asked questions, please visit our website : [email or web address]  For non-scheduling related questions, please contact the cardiac imaging nurse navigator should you have any questions/concerns: Cardiac Imaging Nurse Navigators Direct Office Dial: [phone]   For scheduling needs, including cancellations and rescheduling, please call [place], [phone].   Follow-Up: At [facility], you and your health needs are our priority.  As part of our continuing mission to provide you with exceptional heart care, our providers are all part of one team.  This team includes your primary Cardiologist (physician) and Advanced Practice Providers or APPs (Physician Assistants and Nurse Practitioners) who all work together to provide you with the care you need, when you need it.  Your next appointment:   Follow up will be based on results of the above testing.   We recommend signing up for the patient portal called MyChart.  Sign up information is provided on this After Visit Summary.  MyChart is used to connect with patients for Virtual Visits (Telemedicine).  Patients are able to view lab/test results, encounter notes, upcoming appointments, etc.  Non-urgent messages can be sent to your provider as well.   To learn more about what you can do with MyChart, go to [email or web address].

## 2024-08-27 ENCOUNTER — Ambulatory Visit: Admitting: Adult Health

## 2024-08-27 ENCOUNTER — Encounter: Payer: Self-pay | Admitting: Adult Health

## 2024-08-27 ENCOUNTER — Other Ambulatory Visit: Payer: Self-pay | Admitting: Adult Health

## 2024-08-27 VITALS — BP 110/60 | HR 60 | Temp 98.0°F | Ht 69.0 in | Wt 174.0 lb

## 2024-08-27 DIAGNOSIS — Z7985 Long-term (current) use of injectable non-insulin antidiabetic drugs: Secondary | ICD-10-CM

## 2024-08-27 DIAGNOSIS — I10 Essential (primary) hypertension: Secondary | ICD-10-CM | POA: Diagnosis not present

## 2024-08-27 DIAGNOSIS — E119 Type 2 diabetes mellitus without complications: Secondary | ICD-10-CM

## 2024-08-27 DIAGNOSIS — Z7984 Long term (current) use of oral hypoglycemic drugs: Secondary | ICD-10-CM | POA: Diagnosis not present

## 2024-08-27 DIAGNOSIS — M109 Gout, unspecified: Secondary | ICD-10-CM

## 2024-08-27 LAB — POCT GLYCOSYLATED HEMOGLOBIN (HGB A1C): Hemoglobin A1C: 6.8 % — AB (ref 4.0–5.6)

## 2024-08-27 LAB — URIC ACID: Uric Acid, Serum: 7.9 mg/dL — ABNORMAL HIGH (ref 4.0–7.8)

## 2024-08-27 MED ORDER — TIRZEPATIDE 2.5 MG/0.5ML ~~LOC~~ SOAJ: 2.5000 mg | SUBCUTANEOUS | 0 refills | Status: DC

## 2024-08-27 NOTE — Patient Instructions (Addendum)
 It was great seeing you today   Your A1c dropped to 6.8 - keep up the good work and continue with current medication   Follow up in 3 months

## 2024-08-27 NOTE — Progress Notes (Signed)
 Subjective:    Patient ID: Ronald Pierce , male    DOB: 1960/12/27, 63 y.o.   MRN: [id number]  HPI 63 year old male who  has a past medical history of Diabetes mellitus without complication (HCC), Drug abuse (HCC), Hyperlipidemia, and Hypertension.  DM type 2 - He is currently maintained on Glipizide  10 mg ER BID, Metformin  1000 mg BID, Farxiga  10 mg daily and Mounjaro  2.5 mg weekly (  During his last visit a month ago he wanted to come off Mounjaro  5mg   as he did not like the way it made him feel for a few days after taking it and wanted to try a slower titration so he was placed back on 2.5 mg weekly. He reports feeling good on the 2.5 mg dose.   Today he reports that his blood sugars have been dropping into the 50's - this mostly happens on the weekends when he has a couple of beers. He will become symptomatic with fatigue and feeling off  Lab Results  Component Value Date   HGBA1C 7.5 (H) 05/21/2024   HGBA1C 7.5 (A) 02/20/2024   HGBA1C 7.6 (A) 10/31/2023   Wt Readings from Last 3 Encounters:  08/27/24 174 lb (78.9 kg)  08/25/24 175 lb (79.4 kg)  08/22/24 176 lb (79.8 kg)   HTN - His blood pressure is controlled with lisinopril  30 mg daily and HCTZ 25 mg daily.  He denies dizziness, lightheadedness, chest pain, shortness of breath, or syncopal episodes BP Readings from Last 3 Encounters:  08/27/24 110/60  08/25/24 123/73  08/22/24 (!) 140/85   Gout - has a history of gout. When he has gout flares it is mostly in his left foot ( big toe), he reports that the flares have been becoming more frequent, with the last being a week ago. 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   Hyperlipidemia    Hypertension     Social History   Socioeconomic History   Marital status: Married    Spouse name: Not on file   Number  of children: Not on file   Years of education: Not on file   Highest education level: Not on file  Occupational History   Not on file  Tobacco Use   Smoking status: Former    Current packs/day: 0.00    Types: Cigarettes    Quit date: 11/21/2012    Years since quitting: 11.7   Smokeless tobacco: Never  Substance and Sexual Activity   Alcohol use: Yes    Alcohol/week: 21.0 standard drinks of alcohol    Types: 21 Shots of liquor per week    Comment: beer on weekends   Drug use: No   Sexual activity: Not on file  Other Topics Concern   Not on file  Social History Narrative   High school education    Works at [employer]   Married   One biological child    Social Drivers of Corporate investment banker Strain: Not on file  Food Insecurity: Not on file  Transportation Needs: Not on file  Physical Activity: Not on file  Stress: Not on file  Social Connections: Not on file  Intimate Partner Violence: Not on file    Past Surgical History:  Procedure Laterality Date  left forearm tendon repair     WRIST SURGERY      Family History  Problem Relation Age of Onset   Diabetes Mother    Hypertension Mother    Diabetes Father    Depression Brother    Diabetes Maternal Grandmother    Hypertension Maternal Grandmother    Diabetes Maternal Grandfather    Hypertension Maternal Grandfather    Colon cancer Neg Hx     No Known Allergies  Current Outpatient Medications on File Prior to Visit  Medication Sig Dispense Refill   Blood Glucose Monitoring Suppl DEVI 1 each by Does not apply route in the morning, at noon, and at bedtime. May substitute to any manufacturer covered by patient's insurance. 1 each 0   FARXIGA  10 MG TABS tablet TAKE 1 TABLET BY MOUTH ONCE DAILY BEFORE BREAKFAST (REPLACES  5  MG  DOSE) 270 tablet 1   glipiZIDE  (GLUCOTROL  XL) 10 MG 24 hr tablet TAKE 1 TABLET BY MOUTH IN THE MORNING AND IN THE EVENING 180 tablet 1   hydrochlorothiazide  (HYDRODIURIL ) 25  MG tablet Take 1 tablet by mouth once daily 90 tablet 0   ibuprofen  (ADVIL ) 600 MG tablet TAKE 1 TABLET BY MOUTH EVERY 6 HOURS AS NEEDED 90 tablet 0   lisinopril  (ZESTRIL ) 30 MG tablet Take 1 tablet by mouth once daily 90 tablet 0   metFORMIN  (GLUCOPHAGE ) 1000 MG tablet TAKE 1 TABLET BY MOUTH TWICE DAILY WITH MEALS 180 tablet 0   metoprolol tartrate (LOPRESSOR) 100 MG tablet Take 1 tablet (100 mg total) by mouth as directed. Take one  tablet (2) hrs before your CT scan 1 tablet 0   pravastatin  (PRAVACHOL ) 20 MG tablet Take 1 tablet by mouth once daily 90 tablet 3   tadalafil  (CIALIS ) 20 MG tablet TAKE 1 TABLET BY MOUTH EVERY OTHER DAY AS NEEDED FOR ERECTILE DYSFUNCTION 15 tablet 3   tirzepatide  (MOUNJARO ) 2.5 MG/0.5ML Pen Inject 2.5 mg into the skin once a week. 6 mL 0   No current facility-administered medications on file prior to visit.    BP 110/60   Pulse 60   Temp 98 F (36.7 C) (Oral)   Ht 5' 9 (1.753 m)   Wt 174 lb (78.9 kg)   SpO2 95%   BMI 25.70 kg/m       Objective:   Physical Exam Vitals and nursing note reviewed.  Constitutional:      Appearance: Normal appearance.  Cardiovascular:     Rate and Rhythm: Normal rate and regular rhythm.     Pulses: Normal pulses.     Heart sounds: Normal heart sounds.  Pulmonary:     Effort: Pulmonary effort is normal.     Breath sounds: Normal breath sounds.  Musculoskeletal:        General: Normal range of motion.  Skin:    General: Skin is warm and dry.  Neurological:     General: No focal deficit present.     Mental Status: He is alert and oriented to person, place, and time.  Psychiatric:        Mood and Affect: Mood normal.        Behavior: Behavior normal.        Thought Content: Thought content normal.        Judgment: Judgment normal.       Assessment & Plan:  1. Diabetes mellitus treated with oral medication (HCC) (Primary)  - POC HgB A1c- 6.8  - Continue with Metformin , Glipizide  and  Farxiga .  - Advised  that his blood sugars are likely dropping due to alcohol. If he knows he is going to drink on the weekends he can skip his glipizide  in the morning. Advised not drinking heavily.  - tirzepatide  (MOUNJARO ) 2.5 MG/0.5ML Pen; Inject 2.5 mg into the skin once a week.  Dispense: 6 mL; Refill: 0  2. Long-term current use of injectable noninsulin antidiabetic medication  - POC HgB A1c - tirzepatide  (MOUNJARO ) 2.5 MG/0.5ML Pen; Inject 2.5 mg into the skin once a week.  Dispense: 6 mL; Refill: 0  3. Essential hypertension - Well controlled. No change in medication   4. Acute gout involving toe of left foot, unspecified cause - Likely from beer. Can consider allopurinol  - Uric Acid; Future  Darleene Shape, NP

## 2024-08-28 ENCOUNTER — Other Ambulatory Visit: Payer: Self-pay | Admitting: Adult Health

## 2024-08-28 ENCOUNTER — Ambulatory Visit: Payer: Self-pay | Admitting: Adult Health

## 2024-08-28 DIAGNOSIS — M109 Gout, unspecified: Secondary | ICD-10-CM

## 2024-08-28 MED ORDER — ALLOPURINOL 100 MG PO TABS: 100.0000 mg | ORAL_TABLET | Freq: Every day | ORAL | 3 refills | Status: AC

## 2024-09-10 ENCOUNTER — Ambulatory Visit (HOSPITAL_COMMUNITY)

## 2024-10-07 ENCOUNTER — Other Ambulatory Visit: Payer: Self-pay | Admitting: Adult Health

## 2024-10-07 DIAGNOSIS — I10 Essential (primary) hypertension: Secondary | ICD-10-CM

## 2024-10-23 ENCOUNTER — Other Ambulatory Visit: Payer: Self-pay | Admitting: Adult Health

## 2024-10-23 DIAGNOSIS — I10 Essential (primary) hypertension: Secondary | ICD-10-CM

## 2024-11-20 ENCOUNTER — Other Ambulatory Visit: Payer: Self-pay | Admitting: Adult Health

## 2024-11-20 DIAGNOSIS — E119 Type 2 diabetes mellitus without complications: Secondary | ICD-10-CM

## 2024-12-03 ENCOUNTER — Telehealth: Payer: Self-pay

## 2024-12-03 ENCOUNTER — Ambulatory Visit: Admitting: Adult Health

## 2024-12-03 ENCOUNTER — Encounter: Payer: Self-pay | Admitting: Adult Health

## 2024-12-03 VITALS — BP 120/70 | HR 70 | Temp 98.1°F | Ht 69.0 in | Wt 177.0 lb

## 2024-12-03 DIAGNOSIS — Z7984 Long term (current) use of oral hypoglycemic drugs: Secondary | ICD-10-CM | POA: Diagnosis not present

## 2024-12-03 DIAGNOSIS — I10 Essential (primary) hypertension: Secondary | ICD-10-CM | POA: Diagnosis not present

## 2024-12-03 DIAGNOSIS — Z7985 Long-term (current) use of injectable non-insulin antidiabetic drugs: Secondary | ICD-10-CM | POA: Diagnosis not present

## 2024-12-03 DIAGNOSIS — E119 Type 2 diabetes mellitus without complications: Secondary | ICD-10-CM

## 2024-12-03 LAB — POCT GLYCOSYLATED HEMOGLOBIN (HGB A1C): Hemoglobin A1C: 6.6 % — AB (ref 4.0–5.6)

## 2024-12-03 MED ORDER — FARXIGA 10 MG PO TABS: ORAL_TABLET | ORAL | 2 refills | Status: AC

## 2024-12-03 MED ORDER — TIRZEPATIDE 2.5 MG/0.5ML ~~LOC~~ SOAJ: 2.5000 mg | SUBCUTANEOUS | 2 refills | Status: AC

## 2024-12-03 NOTE — Telephone Encounter (Unsigned)
 Copied from CRM [id number]. Topic: Clinical - Prescription Issue >> Dec 03, 2024 10:00 AM Ronald Pierce wrote: Reason for CRM:pt calling regarding  mounjaro  $185 until copay met , pt needing another rx and wants to know if the medication is required or if he can do without it. Please contact patient to advise . [phone]

## 2024-12-03 NOTE — Progress Notes (Signed)
 "  Subjective:    Patient ID: Ronald Pierce , male    DOB: March 19, 1961, 64 y.o.   MRN: [id number]  HPI 64 year old male who  has a past medical history of Diabetes mellitus without complication (HCC), Drug abuse (HCC), Hyperlipidemia, and Hypertension.  DM type 2 - He is currently maintained on Glipizide  10 mg ER BID, Metformin  1000 mg BID, Farxiga  10 mg daily and Mounjaro  2.5 mg weekly; he cannot tolerate the 5 mg dose.  He denies any recent low blood sugar events. He stays active at his job but does not exercise outside of that. He does not follow a specific diet   Lab Results  Component Value Date   HGBA1C 6.6 (A) 12/03/2024   HGBA1C 6.8 (A) 08/27/2024   HGBA1C 7.5 (H) 05/21/2024   Wt Readings from Last 3 Encounters:  12/03/24 177 lb (80.3 kg)  08/27/24 174 lb (78.9 kg)  08/25/24 175 lb (79.4 kg)   HTN - His blood pressure is controlled with lisinopril  30 mg daily and HCTZ 25 mg daily.  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   Hyperlipidemia    Hypertension     Social History   Socioeconomic History   Marital status: Married    Spouse name: Not on file   Number of children: Not on file   Years of education: Not on file   Highest education level: Not on file  Occupational History   Not on file  Tobacco Use   Smoking status: Former    Current packs/day: 0.00    Types: Cigarettes    Quit date: 11/21/2012    Years since quitting: 12.0   Smokeless tobacco: Never  Substance and Sexual Activity   Alcohol use: Yes    Alcohol/week: 21.0 standard drinks of alcohol    Types: 21 Shots of liquor per week    Comment: beer on weekends   Drug use: No   Sexual  activity: Not on file  Other Topics Concern   Not on file  Social History Narrative   High school education    Works at [employer]   Married   One biological child    Social Drivers of Health   Tobacco Use: Medium Risk (12/03/2024)   Patient History    Smoking Tobacco Use: Former    Smokeless Tobacco Use: Never    Passive Exposure: Not on Actuary Strain: Not on file  Food Insecurity: Not on file  Transportation Needs: Not on file  Physical Activity: Not on file  Stress: Not on file  Social Connections: Not on file  Intimate Partner Violence: Not on file  Depression (PHQ2-9): Medium Risk (02/20/2024)   Depression (PHQ2-9)    PHQ-2 Score: 8  Alcohol Screen: Not on file  Housing: Not on file  Utilities: Not on file  Health Literacy: Not on file    Past Surgical History:  Procedure Laterality Date   left forearm tendon repair     WRIST SURGERY      Family History  Problem Relation Age of Onset   Diabetes Mother    Hypertension Mother    Diabetes  Father    Depression Brother    Diabetes Maternal Grandmother    Hypertension Maternal Grandmother    Diabetes Maternal Grandfather    Hypertension Maternal Grandfather    Colon cancer Neg Hx     Allergies[1]  Medications Ordered Prior to Encounter[2]  BP 120/70   Pulse 70   Temp 98.1 F (36.7 C) (Oral)   Ht 5' 9 (1.753 m)   Wt 177 lb (80.3 kg)   SpO2 98%   BMI 26.14 kg/m       Objective:   Physical Exam Vitals and nursing note reviewed.  Constitutional:      Appearance: Normal appearance.  Cardiovascular:     Rate and Rhythm: Normal rate and regular rhythm.     Pulses: Normal pulses.     Heart sounds: Normal heart sounds.  Pulmonary:     Effort: Pulmonary effort is normal.     Breath sounds: Normal breath sounds.  Skin:    General: Skin is warm and dry.  Neurological:     General: No focal deficit present.     Mental Status: He is alert and oriented to person, place, and  time.  Psychiatric:        Mood and Affect: Mood normal.        Behavior: Behavior normal.        Thought Content: Thought content normal.        Judgment: Judgment normal.        Assessment & Plan:  1. Diabetes mellitus treated with oral medication (HCC) (Primary)  - POC HgB A1c- 6.6. Has improved  - Continue with current medication.  - Follow up in CPE I June 2026  - FARXIGA  10 MG TABS tablet; TAKE 1 TABLET BY MOUTH ONCE DAILY BEFORE BREAKFAST (REPLACES  5  MG  DOSE)  Dispense: 270 tablet; Refill: 2 - tirzepatide  (MOUNJARO ) 2.5 MG/0.5ML Pen; Inject 2.5 mg into the skin once a week.  Dispense: 6 mL; Refill: 2  2. Long-term current use of injectable noninsulin antidiabetic medication  - POC HgB A1c - tirzepatide  (MOUNJARO ) 2.5 MG/0.5ML Pen; Inject 2.5 mg into the skin once a week.  Dispense: 6 mL; Refill: 2  3. Essential hypertension - Well controlled. No change in medication   Darleene Shape, NP     [1] No Known Allergies [2]  Current Outpatient Medications on File Prior to Visit  Medication Sig Dispense Refill   allopurinol  (ZYLOPRIM ) 100 MG tablet Take 1 tablet (100 mg total) by mouth daily. 90 tablet 3   Blood Glucose Monitoring Suppl DEVI 1 each by Does not apply route in the morning, at noon, and at bedtime. May substitute to any manufacturer covered by patient's insurance. 1 each 0   FARXIGA  10 MG TABS tablet TAKE 1 TABLET BY MOUTH ONCE DAILY BEFORE BREAKFAST (REPLACES  5  MG  DOSE) 270 tablet 1   glipiZIDE  (GLUCOTROL  XL) 10 MG 24 hr tablet TAKE 1 TABLET BY MOUTH IN THE MORNING AND IN THE EVENING 180 tablet 0   hydrochlorothiazide  (HYDRODIURIL ) 25 MG tablet Take 1 tablet by mouth once daily 90 tablet 0   ibuprofen  (ADVIL ) 600 MG tablet TAKE 1 TABLET BY MOUTH EVERY 6 HOURS AS NEEDED 90 tablet 0   lisinopril  (ZESTRIL ) 30 MG tablet Take 1 tablet by mouth once daily 90 tablet 0   metFORMIN  (GLUCOPHAGE ) 1000 MG tablet TAKE 1 TABLET BY MOUTH TWICE DAILY WITH MEALS 180 tablet  0   metoprolol  tartrate (LOPRESSOR ) 100 MG  tablet Take 1 tablet (100 mg total) by mouth as directed. Take one  tablet (2) hrs before your CT scan 1 tablet 0   pravastatin  (PRAVACHOL ) 20 MG tablet Take 1 tablet by mouth once daily 90 tablet 3   tadalafil  (CIALIS ) 20 MG tablet TAKE 1 TABLET BY MOUTH EVERY OTHER DAY AS NEEDED FOR ERECTILE DYSFUNCTION 15 tablet 3   tirzepatide  (MOUNJARO ) 2.5 MG/0.5ML Pen Inject 2.5 mg into the skin once a week. 6 mL 0   No current facility-administered medications on file prior to visit.   "

## 2024-12-03 NOTE — Patient Instructions (Addendum)
 Your A1c was 6.6 - this improved from 6.8   Lets keep everything the same   We will follow up at your physical exam, anytime after June 26th 2026

## 2024-12-03 NOTE — Telephone Encounter (Signed)
 Please advise

## 2024-12-04 ENCOUNTER — Other Ambulatory Visit: Payer: Self-pay | Admitting: Adult Health

## 2024-12-04 NOTE — Telephone Encounter (Signed)
 Spoke to pt and he stated that the pharmacist explained once he met his deductible it will be cheaper. Pt stated he will go and pay the money and pick the Rx up.

## 2025-06-04 ENCOUNTER — Ambulatory Visit: Admitting: Adult Health
